# Patient Record
Sex: Female | Born: 1990 | Race: White | Hispanic: No | Marital: Married | Smoking: Never smoker
Health system: Southern US, Community
[De-identification: ages and names within clinical notes are randomized; demographics above are authoritative.]

## PROBLEM LIST (undated history)

## (undated) DIAGNOSIS — R4020 Unspecified coma: Secondary | ICD-10-CM

## (undated) DIAGNOSIS — Z789 Other specified health status: Secondary | ICD-10-CM

## (undated) DIAGNOSIS — Z3480 Encounter for supervision of other normal pregnancy, unspecified trimester: Secondary | ICD-10-CM

## (undated) HISTORY — PX: NO PAST SURGERIES: SHX2092

## (undated) HISTORY — PX: LITHOTRIPSY: SUR834

---
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
## 2010-11-26 NOTE — L&D Delivery Note (Signed)
Delivery Note At 10:25 PM a healthy female was delivered via Vaginal, Spontaneous Delivery (Presentation: Left Occiput Anterior).  APGAR: 8, 9; weight 8 lb 2.7 oz (3706 g).   Placenta status: Intact, Spontaneous.  Cord: 3 vessels with the following complications: None.    Anesthesia: Epidural  Episiotomy: None Lacerations: 2nd degree and right labial Suture Repair: 3.0 vicryl rapide Est. Blood Loss (mL): 450cc  Mom to postpartum.  Baby to nursery-stable.  Oliver Pila 10/28/2011, 10:53 PM

## 2011-03-29 LAB — HIV ANTIBODY (ROUTINE TESTING W REFLEX): HIV: NONREACTIVE

## 2011-03-29 LAB — ABO/RH: RH Type: POSITIVE

## 2011-03-29 LAB — GC/CHLAMYDIA PROBE AMP, GENITAL
Chlamydia: NEGATIVE
Gonorrhea: NEGATIVE

## 2011-03-29 LAB — RUBELLA ANTIBODY, IGM: Rubella: IMMUNE

## 2011-10-05 DIAGNOSIS — N9089 Other specified noninflammatory disorders of vulva and perineum: Secondary | ICD-10-CM

## 2011-10-20 ENCOUNTER — Encounter (HOSPITAL_COMMUNITY): Payer: Self-pay

## 2011-10-20 ENCOUNTER — Inpatient Hospital Stay (HOSPITAL_COMMUNITY)
Admission: AD | Admit: 2011-10-20 | Discharge: 2011-10-20 | Disposition: A | Discharge status: Home / Self Care | Payer: Medicaid Other | Source: Ambulatory Visit | Attending: Obstetrics and Gynecology | Admitting: Obstetrics and Gynecology

## 2011-10-20 DIAGNOSIS — N9089 Other specified noninflammatory disorders of vulva and perineum: Secondary | ICD-10-CM

## 2011-10-20 DIAGNOSIS — N9489 Other specified conditions associated with female genital organs and menstrual cycle: Secondary | ICD-10-CM | POA: Insufficient documentation

## 2011-10-20 DIAGNOSIS — O99891 Other specified diseases and conditions complicating pregnancy: Secondary | ICD-10-CM | POA: Insufficient documentation

## 2011-10-20 HISTORY — DX: Other specified health status: Z78.9

## 2011-10-20 LAB — URINALYSIS, ROUTINE W REFLEX MICROSCOPIC
Bilirubin Urine: NEGATIVE
Glucose, UA: NEGATIVE mg/dL
Ketones, ur: NEGATIVE mg/dL
Nitrite: NEGATIVE
Protein, ur: NEGATIVE mg/dL
Specific Gravity, Urine: 1.01 (ref 1.005–1.030)
Urobilinogen, UA: 0.2 mg/dL (ref 0.0–1.0)
pH: 6.5 (ref 5.0–8.0)

## 2011-10-20 LAB — URINE MICROSCOPIC-ADD ON

## 2011-10-20 NOTE — ED Provider Notes (Signed)
History     Chief Complaint  Patient presents with  . Abdominal Pain   HPI This is a 20 y.o. female at [redacted]w[redacted]d who presents with c/o labial edema which started yesterday and got worse today.  Has tried lying down for a while and did not help. No pain or irritation.Does have some mild contractions.   OB History    Grav Para Term Preterm Abortions TAB SAB Ect Mult Living   1               Past Medical History  Diagnosis Date  . No pertinent past medical history     Past Surgical History  Procedure Date  . No past surgeries     No family history on file.  History  Substance Use Topics  . Smoking status: Never Smoker   . Smokeless tobacco: Not on file  . Alcohol Use: No    Allergies: No Known Allergies  Prescriptions prior to admission  Medication Sig Dispense Refill  . Ranitidine HCl (ZANTAC PO) Take 2 tablets by mouth daily as needed. For acid reflux.         ROS As above  Physical Exam   Blood pressure 123/67, pulse 82, temperature 97.6 F (36.4 C), temperature source Oral, resp. rate 18, height 5\' 1"  (1.549 m), weight 140 lb (63.504 kg).  Physical Exam  Constitutional: She is oriented to person, place, and time. She appears well-developed and well-nourished. No distress.  HENT:  Head: Normocephalic.  Cardiovascular: Normal rate.   Respiratory: Effort normal.  GI: Soft. She exhibits no distension. There is no tenderness.  Genitourinary: Vagina normal and uterus normal. No vaginal discharge found.       Bilateral labia are edematious without erethema. Vagina is normal without edema or erethema. Cervix tight 1cm/50%/-3/vtx.  UCs q 2 min, mild with reactive FHR  Musculoskeletal: Normal range of motion. She exhibits no edema.  Neurological: She is alert and oriented to person, place, and time.  Skin: Skin is warm and dry.  Psychiatric: She has a normal mood and affect.    MAU Course  Procedures   Assessment and Plan  A:  IUP at [redacted]w[redacted]d      Bilateral  labial Edema, probably dependent P:  Discussed with Dr Kathee Polite pack to perineum      Supine, side lying position      Followup in office.      Labor precautions reviewed  [facility] 10/20/2011, 2:28 PM

## 2011-10-20 NOTE — Progress Notes (Signed)
Onset of vaginal area and pressure since this morning, had pinkish discharge two nights ago.

## 2011-10-24 ENCOUNTER — Encounter (HOSPITAL_COMMUNITY): Payer: Self-pay | Admitting: *Deleted

## 2011-10-24 ENCOUNTER — Telehealth (HOSPITAL_COMMUNITY): Payer: Self-pay | Admitting: *Deleted

## 2011-10-24 NOTE — Telephone Encounter (Signed)
Preadmission screen  

## 2011-10-28 ENCOUNTER — Inpatient Hospital Stay (HOSPITAL_COMMUNITY)
Admission: AD | Admit: 2011-10-28 | Discharge: 2011-10-28 | Disposition: A | Discharge status: Home / Self Care | Payer: Medicaid Other | Source: Ambulatory Visit | Attending: Obstetrics and Gynecology | Admitting: Obstetrics and Gynecology

## 2011-10-28 ENCOUNTER — Encounter (HOSPITAL_COMMUNITY): Payer: Self-pay | Admitting: *Deleted

## 2011-10-28 ENCOUNTER — Encounter (HOSPITAL_COMMUNITY): Payer: Self-pay | Admitting: Anesthesiology

## 2011-10-28 ENCOUNTER — Inpatient Hospital Stay (HOSPITAL_COMMUNITY): Payer: Medicaid Other | Admitting: Anesthesiology

## 2011-10-28 ENCOUNTER — Inpatient Hospital Stay (HOSPITAL_COMMUNITY)
Admission: AD | Admit: 2011-10-28 | Discharge: 2011-10-30 | DRG: 775 | Disposition: A | Discharge status: Home / Self Care | Payer: Medicaid Other | Source: Ambulatory Visit | Attending: Obstetrics and Gynecology | Admitting: Obstetrics and Gynecology

## 2011-10-28 ENCOUNTER — Other Ambulatory Visit: Payer: Self-pay | Admitting: Obstetrics and Gynecology

## 2011-10-28 DIAGNOSIS — O479 False labor, unspecified: Secondary | ICD-10-CM | POA: Insufficient documentation

## 2011-10-28 LAB — CBC
HCT: 31.7 % — ABNORMAL LOW (ref 36.0–46.0)
Hemoglobin: 10.6 g/dL — ABNORMAL LOW (ref 12.0–15.0)
RDW: 13 % (ref 11.5–15.5)
WBC: 15.7 10*3/uL — ABNORMAL HIGH (ref 4.0–10.5)

## 2011-10-28 LAB — POCT FERN TEST: Fern Test: NEGATIVE

## 2011-10-28 MED ORDER — EPHEDRINE 5 MG/ML INJ
10.0000 mg | INTRAVENOUS | Status: DC | PRN
Start: 2011-10-28 — End: 2011-10-29

## 2011-10-28 MED ORDER — TERBUTALINE SULFATE 1 MG/ML IJ SOLN: 0.2500 mg | Freq: Once | INTRAMUSCULAR | Status: AC | PRN

## 2011-10-28 MED ORDER — OXYTOCIN BOLUS FROM INFUSION
500.0000 mL | Freq: Once | INTRAVENOUS | Status: DC
  Filled 2011-10-28: qty 500
  Filled 2011-10-28: qty 1000

## 2011-10-28 MED ORDER — IBUPROFEN 600 MG PO TABS
600.0000 mg | ORAL_TABLET | Freq: Four times a day (QID) | ORAL | Status: DC
  Administered 2011-10-29 – 2011-10-30 (×5): 600 mg via ORAL
  Filled 2011-10-28 (×5): qty 1

## 2011-10-28 MED ORDER — OXYTOCIN 20 UNITS IN LACTATED RINGERS INFUSION - SIMPLE
1.0000 m[IU]/min | INTRAVENOUS | Status: DC
  Administered 2011-10-28: 2 m[IU]/min via INTRAVENOUS

## 2011-10-28 MED ORDER — EPHEDRINE 5 MG/ML INJ
10.0000 mg | INTRAVENOUS | Status: DC | PRN
Start: 2011-10-28 — End: 2011-10-29
  Filled 2011-10-28: qty 4

## 2011-10-28 MED ORDER — IBUPROFEN 600 MG PO TABS
600.0000 mg | ORAL_TABLET | Freq: Four times a day (QID) | ORAL | Status: DC | PRN
  Administered 2011-10-28: 600 mg via ORAL
  Filled 2011-10-28: qty 1

## 2011-10-28 MED ORDER — BUTORPHANOL TARTRATE 2 MG/ML IJ SOLN
1.0000 mg | Freq: Once | INTRAMUSCULAR | Status: AC
  Administered 2011-10-28: 1 mg via INTRAVENOUS
  Filled 2011-10-28: qty 1

## 2011-10-28 MED ORDER — LACTATED RINGERS IV SOLN
500.0000 mL | Freq: Once | INTRAVENOUS | Status: AC
  Administered 2011-10-28: 500 mL via INTRAVENOUS

## 2011-10-28 MED ORDER — ACETAMINOPHEN 325 MG PO TABS: 650.0000 mg | ORAL_TABLET | ORAL | Status: DC | PRN

## 2011-10-28 MED ORDER — ONDANSETRON HCL 4 MG/2ML IJ SOLN: 4.0000 mg | Freq: Four times a day (QID) | INTRAMUSCULAR | Status: DC | PRN

## 2011-10-28 MED ORDER — OXYCODONE-ACETAMINOPHEN 5-325 MG PO TABS: 2.0000 | ORAL_TABLET | ORAL | Status: DC | PRN

## 2011-10-28 MED ORDER — PROMETHAZINE HCL 25 MG/ML IJ SOLN
12.5000 mg | Freq: Once | INTRAMUSCULAR | Status: AC
  Administered 2011-10-28: 12.5 mg via INTRAVENOUS
  Filled 2011-10-28: qty 1

## 2011-10-28 MED ORDER — CITRIC ACID-SODIUM CITRATE 334-500 MG/5ML PO SOLN: 30.0000 mL | ORAL | Status: DC | PRN

## 2011-10-28 MED ORDER — PHENYLEPHRINE 40 MCG/ML (10ML) SYRINGE FOR IV PUSH (FOR BLOOD PRESSURE SUPPORT)
80.0000 ug | PREFILLED_SYRINGE | INTRAVENOUS | Status: DC | PRN
  Filled 2011-10-28: qty 5

## 2011-10-28 MED ORDER — FENTANYL 2.5 MCG/ML BUPIVACAINE 1/10 % EPIDURAL INFUSION (WH - ANES)
INTRAMUSCULAR | Status: DC | PRN
  Administered 2011-10-28: 13 mL/h via EPIDURAL

## 2011-10-28 MED ORDER — LACTATED RINGERS IV SOLN
500.0000 mL | INTRAVENOUS | Status: DC | PRN
  Administered 2011-10-28: 300 mL via INTRAVENOUS

## 2011-10-28 MED ORDER — LIDOCAINE HCL 1.5 % IJ SOLN
INTRAMUSCULAR | Status: DC | PRN
  Administered 2011-10-28: 3 mL via EPIDURAL
  Administered 2011-10-28: 4 mL via EPIDURAL

## 2011-10-28 MED ORDER — LIDOCAINE HCL (PF) 1 % IJ SOLN
30.0000 mL | INTRAMUSCULAR | Status: DC | PRN
Start: 2011-10-28 — End: 2011-10-29
  Filled 2011-10-28: qty 30

## 2011-10-28 MED ORDER — FENTANYL 2.5 MCG/ML BUPIVACAINE 1/10 % EPIDURAL INFUSION (WH - ANES)
14.0000 mL/h | INTRAMUSCULAR | Status: DC
  Administered 2011-10-28: 14 mL/h via EPIDURAL
  Filled 2011-10-28 (×2): qty 60

## 2011-10-28 MED ORDER — FLEET ENEMA 7-19 GM/118ML RE ENEM: 1.0000 | ENEMA | RECTAL | Status: DC | PRN

## 2011-10-28 MED ORDER — ZOLPIDEM TARTRATE 10 MG PO TABS
10.0000 mg | ORAL_TABLET | Freq: Once | ORAL | Status: AC
  Administered 2011-10-28: 10 mg via ORAL
  Filled 2011-10-28: qty 1

## 2011-10-28 MED ORDER — LACTATED RINGERS IV SOLN
INTRAVENOUS | Status: DC
  Administered 2011-10-28 (×2): via INTRAVENOUS

## 2011-10-28 MED ORDER — PHENYLEPHRINE 40 MCG/ML (10ML) SYRINGE FOR IV PUSH (FOR BLOOD PRESSURE SUPPORT): 80.0000 ug | PREFILLED_SYRINGE | INTRAVENOUS | Status: DC | PRN

## 2011-10-28 MED ORDER — OXYTOCIN 20 UNITS IN LACTATED RINGERS INFUSION - SIMPLE
125.0000 mL/h | Freq: Once | INTRAVENOUS | Status: AC
  Administered 2011-10-28: 500 mL/h via INTRAVENOUS

## 2011-10-28 MED ORDER — DIPHENHYDRAMINE HCL 50 MG/ML IJ SOLN: 12.5000 mg | INTRAMUSCULAR | Status: DC | PRN

## 2011-10-28 NOTE — Progress Notes (Signed)
Notified of no cervical change. OK to d/c home

## 2011-10-28 NOTE — Progress Notes (Signed)
Was given Ambien prior to discharge dizziness, nausea,  Unsure if water has broken.

## 2011-10-28 NOTE — Progress Notes (Signed)
Dr. Senaida Ores updated on pt status. Pt to have epidural for pain

## 2011-10-28 NOTE — Progress Notes (Signed)
Dr Senaida Ores notified of pt's admission and status. Aware of ctx pattern, sve, FHR baseline. Will observe for an hour and reck cervix and d/c home if no change and FHR reactive.

## 2011-10-28 NOTE — Progress Notes (Signed)
Pt was admitted to L&D and received stadol x 1 then an epidural.  She made progress and was 4cm after her epidural.  She had a few late decels that resolved with O2 and repositioning.  She was given 2mu pitocin, but this was discontinued with the lates and she has made progress without it.    Subjective: Comfortable with epidural   Objective: BP 107/62  Pulse 100  Temp(Src) 98.2 F (36.8 C) (Oral)  Resp 18  Ht 5\' 5"  (1.651 m)  Wt 63.05 kg (139 lb)  BMI 23.13 kg/m2  SpO2 100%      FHT:  FHR: 130 bpm, variability: moderate,  accelerations:  Present,  decelerations:  Absent UC:   regular, every 1-3 minutes SVE:   90/5-6/0  When going to rupture patient noted she had already ruptured at 8-830pm  Very light meconium seen. Labs: Lab Results  Component Value Date   WBC 15.7* 10/28/2011   HGB 10.6* 10/28/2011   HCT 31.7* 10/28/2011   MCV 89.3 10/28/2011   PLT 260 10/28/2011    Assessment / Plan: Spontaneous labor, progressing normally  Doing well, making progress FHR now category II  Good variability  Tadan Shill W 10/28/2011, 9:09 PM

## 2011-10-28 NOTE — Progress Notes (Signed)
Written and verbal d/c instructions given and understanding vocied. To return in am for induction as scheduled if does not return sooner or if Dr Ambrose Mantle calls pt this am and has her return tonight for cervidil as MD discussed with pt at last office visit

## 2011-10-28 NOTE — Anesthesia Preprocedure Evaluation (Signed)
Anesthesia Evaluation  Patient identified by MRN, date of birth, ID band Patient awake    Reviewed: Allergy & Precautions, H&P , Patient's Chart, lab work & pertinent test results  Airway Mallampati: III TM Distance: >3 FB Neck ROM: full    Dental No notable dental hx. (+) Teeth Intact   Pulmonary neg pulmonary ROS,  clear to auscultation  Pulmonary exam normal       Cardiovascular neg cardio ROS regular Normal    Neuro/Psych Negative Neurological ROS  Negative Psych ROS   GI/Hepatic negative GI ROS, Neg liver ROS, GERD-  Medicated and Controlled,  Endo/Other  Negative Endocrine ROS  Renal/GU negative Renal ROS  Genitourinary negative   Musculoskeletal   Abdominal   Peds  Hematology negative hematology ROS (+)   Anesthesia Other Findings   Reproductive/Obstetrics (+) Pregnancy                           Anesthesia Physical Anesthesia Plan  ASA: II  Anesthesia Plan: Epidural   Post-op Pain Management:    Induction:   Airway Management Planned:   Additional Equipment:   Intra-op Plan:   Post-operative Plan:   Informed Consent: I have reviewed the patients History and Physical, chart, labs and discussed the procedure including the risks, benefits and alternatives for the proposed anesthesia with the patient or authorized representative who has indicated his/her understanding and acceptance.     Plan Discussed with: Anesthesiologist and Surgeon  Anesthesia Plan Comments:         Anesthesia Quick Evaluation

## 2011-10-28 NOTE — Progress Notes (Signed)
Went home, did not go to bed after receiving ambien, ctx worsened.

## 2011-10-28 NOTE — Progress Notes (Signed)
G1 at 40.5wks. Ctxs since 0230. Denies leaking fld or bleeding

## 2011-10-28 NOTE — Progress Notes (Signed)
Pt aware of plan of care and agrees. States Dr Ambrose Mantle was to call her this am about what time to come Sunday pm for cervidil. Scheduled for induction Monday am.

## 2011-10-28 NOTE — Anesthesia Procedure Notes (Signed)
Epidural Patient location during procedure: OB Start time: 10/28/2011 6:08 PM  Staffing Anesthesiologist: Hailey Stormer A. Performed by: anesthesiologist   Preanesthetic Checklist Completed: patient identified, site marked, surgical consent, pre-op evaluation, timeout performed, IV checked, risks and benefits discussed and monitors and equipment checked  Epidural Patient position: sitting Prep: site prepped and draped and DuraPrep Patient monitoring: continuous pulse ox and blood pressure Approach: midline Injection technique: LOR air  Needle:  Needle type: Tuohy  Needle gauge: 17 G Needle length: 9 cm Needle insertion depth: 6 cm Catheter type: closed end flexible Catheter size: 19 Gauge Catheter at skin depth: 11 cm Test dose: negative and 1.5% lidocaine  Assessment Events: blood not aspirated, injection not painful, no injection resistance, negative IV test and no paresthesia  Additional Notes Patient is more comfortable after epidural dosed. Please see RN's note for documentation of vital signs and FHR which are stable.

## 2011-10-28 NOTE — H&P (Signed)
Laura Little is a 20 y.o. female G1P0 at 15 6/7 weeks (EDD 10/23/11 by a 10 week [place]) presenting for third visit to MAU today for latent phase labor.  Cervix has minimally changed from 1 CM to 90% effaced and 1-2 cm.  Prenatal care has been uneventrful.  Maternal Medical History:  Reason for admission: Reason for admission: contractions.  Contractions: Onset was 6-12 hours ago.   Frequency: regular.   Perceived severity is strong.    Fetal activity: Perceived fetal activity is normal.      OB History    Grav Para Term Preterm Abortions TAB SAB Ect Mult Living   1              Past Medical History  Diagnosis Date  . No pertinent past medical history    Past Surgical History  Procedure Date  . No past surgeries    Family History: family history includes Heart attack in her paternal grandfather and Hypertension in her paternal grandfather. Social History:  reports that she has never smoked. She does not have any smokeless tobacco history on file. She reports that she does not drink alcohol or use illicit drugs.  ROS  Dilation: 1.5 Effacement (%): 90 Station: -1 Exam by:: L Davis RN Blood pressure 114/64, pulse 87, temperature 98 F (36.7 C), resp. rate 18, height 5\' 5"  (1.651 m), weight 63.05 kg (139 lb). Maternal Exam:  Uterine Assessment: Contraction strength is moderate.  Contraction frequency is regular.   Abdomen: Patient reports no abdominal tenderness. Fetal presentation: vertex  Introitus: Normal vulva. Normal vagina.    Physical Exam  Constitutional: She is oriented to person, place, and time. She appears well-developed and well-nourished.  Cardiovascular: Normal rate and regular rhythm.   Respiratory: Effort normal and breath sounds normal.  GI: Soft. Bowel sounds are normal.  Genitourinary: Vagina normal and uterus normal.       Cervix 90/1-2cm per RN  Neurological: She is alert and oriented to person, place, and time.  Psychiatric: She has a normal mood  and affect. Her behavior is normal.    Prenatal labs: ABO, Rh: A/Positive/-- (05/03 0000) Antibody: Negative (05/03 0000) Rubella: Immune (05/03 0000) RPR: Nonreactive (05/03 0000)  HBsAg: Negative (05/03 0000)  HIV: Non-reactive (05/03 0000)  GBS: Negative (10/26 0000)  One hour glucola 95 Quad screen WNL  Assessment/Plan: Pt being admitted in latent phase labor for discomfort, will augment as needed. Epidural as needed.  Oliver Pila 10/28/2011, 2:46 PM

## 2011-10-29 ENCOUNTER — Inpatient Hospital Stay (HOSPITAL_COMMUNITY): Admission: RE | Admit: 2011-10-29 | Payer: Medicaid Other | Source: Ambulatory Visit

## 2011-10-29 LAB — CBC
Hemoglobin: 8.6 g/dL — ABNORMAL LOW (ref 12.0–15.0)
MCH: 29.8 pg (ref 26.0–34.0)
RBC: 2.89 MIL/uL — ABNORMAL LOW (ref 3.87–5.11)

## 2011-10-29 MED ORDER — BENZOCAINE-MENTHOL 20-0.5 % EX AERO
INHALATION_SPRAY | CUTANEOUS | Status: AC
  Administered 2011-10-29: 1 via TOPICAL
  Filled 2011-10-29: qty 56

## 2011-10-29 MED ORDER — SIMETHICONE 80 MG PO CHEW: 80.0000 mg | CHEWABLE_TABLET | ORAL | Status: DC | PRN

## 2011-10-29 MED ORDER — OXYCODONE-ACETAMINOPHEN 5-325 MG PO TABS
1.0000 | ORAL_TABLET | ORAL | Status: DC | PRN
Start: 2011-10-29 — End: 2011-10-30
  Administered 2011-10-29: 1 via ORAL
  Filled 2011-10-29: qty 1

## 2011-10-29 MED ORDER — DIBUCAINE 1 % RE OINT: 1.0000 "application " | TOPICAL_OINTMENT | RECTAL | Status: DC | PRN

## 2011-10-29 MED ORDER — TETANUS-DIPHTH-ACELL PERTUSSIS 5-2.5-18.5 LF-MCG/0.5 IM SUSP
0.5000 mL | Freq: Once | INTRAMUSCULAR | Status: AC
  Administered 2011-10-29: 0.5 mL via INTRAMUSCULAR
  Filled 2011-10-29: qty 0.5

## 2011-10-29 MED ORDER — ONDANSETRON HCL 4 MG/2ML IJ SOLN: 4.0000 mg | INTRAMUSCULAR | Status: DC | PRN

## 2011-10-29 MED ORDER — LANOLIN HYDROUS EX OINT: TOPICAL_OINTMENT | CUTANEOUS | Status: DC | PRN

## 2011-10-29 MED ORDER — DIPHENHYDRAMINE HCL 25 MG PO CAPS: 25.0000 mg | ORAL_CAPSULE | Freq: Four times a day (QID) | ORAL | Status: DC | PRN

## 2011-10-29 MED ORDER — SENNOSIDES-DOCUSATE SODIUM 8.6-50 MG PO TABS: 2.0000 | ORAL_TABLET | Freq: Every day | ORAL | Status: DC

## 2011-10-29 MED ORDER — PRENATAL PLUS 27-1 MG PO TABS
1.0000 | ORAL_TABLET | Freq: Every day | ORAL | Status: DC
  Administered 2011-10-29 – 2011-10-30 (×2): 1 via ORAL
  Filled 2011-10-29 (×2): qty 1

## 2011-10-29 MED ORDER — ZOLPIDEM TARTRATE 5 MG PO TABS: 5.0000 mg | ORAL_TABLET | Freq: Every evening | ORAL | Status: DC | PRN

## 2011-10-29 MED ORDER — WITCH HAZEL-GLYCERIN EX PADS: 1.0000 "application " | MEDICATED_PAD | CUTANEOUS | Status: DC | PRN

## 2011-10-29 MED ORDER — BENZOCAINE-MENTHOL 20-0.5 % EX AERO
1.0000 "application " | INHALATION_SPRAY | CUTANEOUS | Status: DC | PRN
  Administered 2011-10-29: 1 via TOPICAL

## 2011-10-29 MED ORDER — ONDANSETRON HCL 4 MG PO TABS: 4.0000 mg | ORAL_TABLET | ORAL | Status: DC | PRN

## 2011-10-29 NOTE — Progress Notes (Signed)
Pt taken to mbw room 130 via wheelchair at 0010, support person present, report given to mb rn

## 2011-10-29 NOTE — Progress Notes (Signed)
UR chart review completed.  

## 2011-10-29 NOTE — Progress Notes (Signed)
Post Partum Day 1 Subjective: no complaints and tolerating PO  Objective: Blood pressure 107/65, pulse 97, temperature 98.6 F (37 C), temperature source Oral, resp. rate 18, height 5\' 5"  (1.651 m), weight 63.05 kg (139 lb), SpO2 99.00%, unknown if currently breastfeeding.  Physical Exam:  General: alert Lochia: appropriate Uterine Fundus: firm    Basename 10/29/11 0520 10/28/11 1542  HGB 8.6* 10.6*  HCT 25.8* 31.7*    Assessment/Plan: Plan for discharge tomorrow   LOS: 1 day   Hyatt Capobianco W 10/29/2011, 10:39 AM

## 2011-10-29 NOTE — Anesthesia Postprocedure Evaluation (Signed)
  Anesthesia Post-op Note  Patient: Laura Little  Procedure(s) Performed: * No procedures listed *  Patient Location: Mother/Baby  Anesthesia Type: Epidural  Level of Consciousness: alert  and oriented  Airway and Oxygen Therapy: Patient Spontanous Breathing  Post-op Pain: mild  Post-op Assessment: Patient's Cardiovascular Status Stable and Respiratory Function Stable  Post-op Vital Signs: stable  Complications: No apparent anesthesia complications

## 2011-10-30 MED ORDER — IBUPROFEN 600 MG PO TABS: 600.0000 mg | ORAL_TABLET | Freq: Four times a day (QID) | ORAL | Status: AC

## 2011-10-30 NOTE — Discharge Summary (Signed)
Obstetric Discharge Summary Reason for Admission: onset of labor Prenatal Procedures: none Intrapartum Procedures: spontaneous vaginal delivery Postpartum Procedures: none Complications-Operative and Postpartum: second degree perineal laceration and right labial laceration Hemoglobin  Date Value Range Status  10/29/2011 8.6* 12.0-15.0 (g/dL) Final     DELTA CHECK NOTED     REPEATED TO VERIFY     HCT  Date Value Range Status  10/29/2011 25.8* 36.0-46.0 (%) Final    Discharge Diagnoses: Term Pregnancy-delivered  Discharge Information: Date: 10/30/2011 Activity: pelvic rest Diet: routine Medications: Ibuprofen Condition: improved Instructions: refer to practice specific booklet Discharge to: home Follow-up Information    Follow up with Rui Wordell W. Make an appointment in 6 weeks.   Contact information:   [address] [place] [address] [phone]          Newborn Data: Live born female  Birth Weight: 8 lb 2.7 oz (3706 g) APGAR: 8, 9  Home with mother.  Laura Little 10/30/2011, 9:20 AM

## 2011-10-30 NOTE — Progress Notes (Signed)
Post Partum Day 2 Subjective: no complaints and tolerating PO  Objective: Blood pressure 116/64, pulse 69, temperature 98.8 F (37.1 C), temperature source Oral, resp. rate 18, height 5\' 5"  (1.651 m), weight 63.05 kg (139 lb), SpO2 99.00%, unknown if currently breastfeeding.  Physical Exam:  General: alert Lochia: appropriate Uterine Fundus: firm   Basename 10/29/11 0520 10/28/11 1542  HGB 8.6* 10.6*  HCT 25.8* 31.7*    Assessment/Plan: Discharge home F/u office 6 weeks   LOS: 2 days   Azaiah Mello W 10/30/2011, 8:37 AM

## 2013-07-21 LAB — OB RESULTS CONSOLE GC/CHLAMYDIA
Chlamydia: NEGATIVE
Gonorrhea: NEGATIVE

## 2013-07-21 LAB — OB RESULTS CONSOLE ABO/RH: RH TYPE: POSITIVE

## 2013-07-21 LAB — OB RESULTS CONSOLE HEPATITIS B SURFACE ANTIGEN: HEP B S AG: NEGATIVE

## 2013-07-21 LAB — OB RESULTS CONSOLE RUBELLA ANTIBODY, IGM: Rubella: IMMUNE

## 2013-07-21 LAB — OB RESULTS CONSOLE RPR: RPR: NONREACTIVE

## 2013-07-21 LAB — OB RESULTS CONSOLE ANTIBODY SCREEN: ANTIBODY SCREEN: NEGATIVE

## 2013-07-21 LAB — OB RESULTS CONSOLE HIV ANTIBODY (ROUTINE TESTING): HIV: NONREACTIVE

## 2013-11-26 NOTE — L&D Delivery Note (Signed)
Delivery Note At 4:36 PM a viable and healthy female was delivered via Vaginal, Spontaneous Delivery (Presentation: ; Occiput Anterior).  APGAR: 8, 9; weight P.   Placenta status: Intact, Spontaneous.  Cord: 3 vessels with the following complications: None.   Anesthesia: Epidural  Episiotomy: None Lacerations: 2nd degree;Perineal Suture Repair: 3.0 vicryl Est. Blood Loss (mL): 400cc  Mom to postpartum.  Baby to Couplet care / Skin to Skin.  Stuckert, Danisa Kopec Bovard 02/22/2014, 4:57 PM  Br/ Contra - condoms/ A+ / RI

## 2014-02-17 ENCOUNTER — Telehealth (HOSPITAL_COMMUNITY): Payer: Self-pay | Admitting: *Deleted

## 2014-02-17 ENCOUNTER — Encounter (HOSPITAL_COMMUNITY): Payer: Self-pay | Admitting: *Deleted

## 2014-02-17 NOTE — Telephone Encounter (Signed)
Preadmission screen  

## 2014-02-21 ENCOUNTER — Encounter (HOSPITAL_COMMUNITY): Payer: Self-pay

## 2014-02-21 DIAGNOSIS — R4020 Unspecified coma: Secondary | ICD-10-CM

## 2014-02-21 DIAGNOSIS — Z3480 Encounter for supervision of other normal pregnancy, unspecified trimester: Secondary | ICD-10-CM

## 2014-02-21 HISTORY — DX: Unspecified coma: R40.20

## 2014-02-21 HISTORY — DX: Encounter for supervision of other normal pregnancy, unspecified trimester: Z34.80

## 2014-02-21 MED ORDER — FAMOTIDINE 20 MG PO TABS
20.0000 mg | ORAL_TABLET | Freq: Every day | ORAL | Status: DC
  Filled 2014-02-21: qty 1

## 2014-02-21 NOTE — H&P (Signed)
Laura Little is a 23 y.o. female G2P1001 at 33 week for IOL, pt with increasing vasovagal sx's at term.  +FM, no LOF, no VB, occ ctx.  Relatively uncomplicated PNC, see records for details. D/W pt r/b/a of IOL, wishes to proceed.    Maternal Medical History:  Contractions: Frequency: irregular.    Fetal activity: Perceived fetal activity is normal.    Prenatal complications: no prenatal complications Prenatal Complications - Diabetes: none.    OB History   Grav Para Term Preterm Abortions TAB SAB Ect Mult Living   2 1 1       1     G1 TSVD 8#2, female G2 present No abn pap, No STD  Past Medical History  Diagnosis Date  . No pertinent past medical history   . Normal pregnancy, repeat 02/21/2014  . LOC (loss of consciousness) 02/21/2014   Past Surgical History  Procedure Laterality Date  . No past surgeries     Family History: family history includes Heart attack in her paternal grandfather; Hypertension in her paternal grandfather. Social History:  reports that she has never smoked. She has never used smokeless tobacco. She reports that she does not drink alcohol or use illicit drugs.married, dental asst Meds PNV All NKDA   Prenatal Transfer Tool  Maternal Diabetes: No Genetic Screening: Declined Maternal Ultrasounds/Referrals: Normal Fetal Ultrasounds or other Referrals:  None Maternal Substance Abuse:  No Significant Maternal Medications:  None Significant Maternal Lab Results:  Lab values include: Group B Strep negative Other Comments:  None  Review of Systems  Constitutional: Negative.   HENT: Negative.   Eyes: Negative.   Respiratory: Negative.   Cardiovascular: Negative.   Gastrointestinal: Negative.   Genitourinary: Negative.   Musculoskeletal: Negative.   Skin: Negative.   Neurological: Negative.   Psychiatric/Behavioral: Negative.       Last menstrual period 06/02/2013, unknown if currently breastfeeding. Maternal Exam:  Uterine Assessment:  Contraction frequency is irregular.   Abdomen: Fundal height is appropriate for gestation.   Estimated fetal weight is 7.5-8.5#.   Fetal presentation: vertex  Introitus: Normal vulva. Normal vagina.  Pelvis: adequate for delivery.   Cervix: Cervix evaluated by digital exam.     Physical Exam  Constitutional: She is oriented to person, place, and time. She appears well-developed and well-nourished.  HENT:  Head: Normocephalic and atraumatic.  Cardiovascular: Normal rate and regular rhythm.   Respiratory: Effort normal and breath sounds normal. No respiratory distress. She has no wheezes.  GI: Soft. Bowel sounds are normal. She exhibits no distension. There is no tenderness.  Musculoskeletal: Normal range of motion.  Neurological: She is alert and oriented to person, place, and time.  Skin: Skin is warm and dry.  Psychiatric: She has a normal mood and affect. Her behavior is normal.    Prenatal labs: ABO, Rh: A/Positive/-- (08/26 0000) Antibody: Negative (08/26 0000) Rubella: Immune (08/26 0000) RPR: Nonreactive (08/26 0000)  HBsAg: Negative (08/26 0000)  HIV: Non-reactive (08/26 0000)  GBS:   neg  Hgb 12.4/Pl 234K/Pap WNL/Ur Cx neg/GC neg/ Chl neg/CF neg/glucola 98  Dated by 1st tri [place] EDC 03/01/14 Nl anat, post plac, female  Tdap 12/11/13, Flu 10/16/13  Assessment/Plan: 23yo G2P1001 at 39 wk for IOL gbbs neg IOL with pitocin/AROM Expect SVD Epidural prn   Stuckert, Aidenjames Heckmann Bovard 02/21/2014, 8:54 PM

## 2014-02-22 ENCOUNTER — Encounter (HOSPITAL_COMMUNITY): Payer: BC Managed Care – PPO | Admitting: Anesthesiology

## 2014-02-22 ENCOUNTER — Inpatient Hospital Stay (HOSPITAL_COMMUNITY): Payer: BC Managed Care – PPO | Admitting: Anesthesiology

## 2014-02-22 ENCOUNTER — Encounter (HOSPITAL_COMMUNITY): Payer: Self-pay | Admitting: *Deleted

## 2014-02-22 ENCOUNTER — Inpatient Hospital Stay (HOSPITAL_COMMUNITY)
Admission: AD | Admit: 2014-02-22 | Discharge: 2014-02-23 | DRG: 775 | Disposition: A | Discharge status: Home / Self Care | Payer: BC Managed Care – PPO | Source: Ambulatory Visit | Attending: Obstetrics and Gynecology | Admitting: Obstetrics and Gynecology

## 2014-02-22 ENCOUNTER — Inpatient Hospital Stay (HOSPITAL_COMMUNITY)
Admission: RE | Admit: 2014-02-22 | Discharge: 2014-02-22 | Disposition: A | Discharge status: Home / Self Care | Payer: Medicaid Other | Source: Ambulatory Visit | Attending: Obstetrics and Gynecology | Admitting: Obstetrics and Gynecology

## 2014-02-22 DIAGNOSIS — Z3480 Encounter for supervision of other normal pregnancy, unspecified trimester: Secondary | ICD-10-CM

## 2014-02-22 DIAGNOSIS — Z3490 Encounter for supervision of normal pregnancy, unspecified, unspecified trimester: Secondary | ICD-10-CM

## 2014-02-22 DIAGNOSIS — R4020 Unspecified coma: Secondary | ICD-10-CM

## 2014-02-22 DIAGNOSIS — K219 Gastro-esophageal reflux disease without esophagitis: Secondary | ICD-10-CM | POA: Diagnosis present

## 2014-02-22 HISTORY — DX: Encounter for supervision of other normal pregnancy, unspecified trimester: Z34.80

## 2014-02-22 HISTORY — DX: Unspecified coma: R40.20

## 2014-02-22 LAB — CBC
HEMATOCRIT: 31.4 % — AB (ref 36.0–46.0)
Hemoglobin: 10.3 g/dL — ABNORMAL LOW (ref 12.0–15.0)
MCH: 29.5 pg (ref 26.0–34.0)
MCHC: 32.8 g/dL (ref 30.0–36.0)
MCV: 90 fL (ref 78.0–100.0)
Platelets: 283 10*3/uL (ref 150–400)
RBC: 3.49 MIL/uL — AB (ref 3.87–5.11)
RDW: 13.5 % (ref 11.5–15.5)
WBC: 8.2 10*3/uL (ref 4.0–10.5)

## 2014-02-22 LAB — RPR: RPR: NONREACTIVE

## 2014-02-22 LAB — OB RESULTS CONSOLE GBS: GBS: NEGATIVE

## 2014-02-22 MED ORDER — LACTATED RINGERS IV SOLN: INTRAVENOUS | Status: DC

## 2014-02-22 MED ORDER — LIDOCAINE HCL (PF) 1 % IJ SOLN
30.0000 mL | INTRAMUSCULAR | Status: DC | PRN
  Filled 2014-02-22: qty 30

## 2014-02-22 MED ORDER — BUTORPHANOL TARTRATE 1 MG/ML IJ SOLN: 1.0000 mg | INTRAMUSCULAR | Status: DC | PRN

## 2014-02-22 MED ORDER — SENNOSIDES-DOCUSATE SODIUM 8.6-50 MG PO TABS
2.0000 | ORAL_TABLET | ORAL | Status: DC
  Administered 2014-02-22: 2 via ORAL
  Filled 2014-02-22: qty 2

## 2014-02-22 MED ORDER — DIBUCAINE 1 % RE OINT
1.0000 "application " | TOPICAL_OINTMENT | RECTAL | Status: DC | PRN
  Administered 2014-02-23: 1 via RECTAL
  Filled 2014-02-22: qty 28

## 2014-02-22 MED ORDER — FENTANYL 2.5 MCG/ML BUPIVACAINE 1/10 % EPIDURAL INFUSION (WH - ANES)
INTRAMUSCULAR | Status: DC | PRN
  Administered 2014-02-22: 14 mL/h via EPIDURAL

## 2014-02-22 MED ORDER — PRENATAL MULTIVITAMIN CH
1.0000 | ORAL_TABLET | Freq: Every day | ORAL | Status: DC
  Administered 2014-02-23: 1 via ORAL
  Filled 2014-02-22: qty 1

## 2014-02-22 MED ORDER — DIPHENHYDRAMINE HCL 50 MG/ML IJ SOLN: 12.5000 mg | INTRAMUSCULAR | Status: DC | PRN

## 2014-02-22 MED ORDER — OXYTOCIN 40 UNITS IN LACTATED RINGERS INFUSION - SIMPLE MED: 62.5000 mL/h | INTRAVENOUS | Status: DC

## 2014-02-22 MED ORDER — BENZOCAINE-MENTHOL 20-0.5 % EX AERO
1.0000 "application " | INHALATION_SPRAY | CUTANEOUS | Status: DC | PRN
  Filled 2014-02-22: qty 56

## 2014-02-22 MED ORDER — DIPHENHYDRAMINE HCL 25 MG PO CAPS: 25.0000 mg | ORAL_CAPSULE | Freq: Four times a day (QID) | ORAL | Status: DC | PRN

## 2014-02-22 MED ORDER — SIMETHICONE 80 MG PO CHEW: 80.0000 mg | CHEWABLE_TABLET | ORAL | Status: DC | PRN

## 2014-02-22 MED ORDER — OXYTOCIN BOLUS FROM INFUSION: 500.0000 mL | INTRAVENOUS | Status: DC

## 2014-02-22 MED ORDER — EPHEDRINE 5 MG/ML INJ
10.0000 mg | INTRAVENOUS | Status: DC | PRN
  Filled 2014-02-22: qty 4
  Filled 2014-02-22: qty 2

## 2014-02-22 MED ORDER — FAMOTIDINE 20 MG PO TABS: 20.0000 mg | ORAL_TABLET | Freq: Every day | ORAL | Status: DC

## 2014-02-22 MED ORDER — PHENYLEPHRINE 40 MCG/ML (10ML) SYRINGE FOR IV PUSH (FOR BLOOD PRESSURE SUPPORT)
80.0000 ug | PREFILLED_SYRINGE | INTRAVENOUS | Status: DC | PRN
  Filled 2014-02-22: qty 10
  Filled 2014-02-22: qty 2

## 2014-02-22 MED ORDER — FLEET ENEMA 7-19 GM/118ML RE ENEM: 1.0000 | ENEMA | RECTAL | Status: DC | PRN

## 2014-02-22 MED ORDER — FENTANYL 2.5 MCG/ML BUPIVACAINE 1/10 % EPIDURAL INFUSION (WH - ANES)
14.0000 mL/h | INTRAMUSCULAR | Status: DC | PRN
  Filled 2014-02-22: qty 125

## 2014-02-22 MED ORDER — OXYTOCIN 40 UNITS IN LACTATED RINGERS INFUSION - SIMPLE MED
1.0000 m[IU]/min | INTRAVENOUS | Status: DC
  Administered 2014-02-22: 666 m[IU]/min via INTRAVENOUS
  Administered 2014-02-22: 2 m[IU]/min via INTRAVENOUS
  Filled 2014-02-22: qty 1000

## 2014-02-22 MED ORDER — LACTATED RINGERS IV SOLN
500.0000 mL | INTRAVENOUS | Status: DC | PRN
Start: 2014-02-22 — End: 2014-02-22

## 2014-02-22 MED ORDER — TERBUTALINE SULFATE 1 MG/ML IJ SOLN: 0.2500 mg | Freq: Once | INTRAMUSCULAR | Status: DC | PRN

## 2014-02-22 MED ORDER — PHENYLEPHRINE 40 MCG/ML (10ML) SYRINGE FOR IV PUSH (FOR BLOOD PRESSURE SUPPORT)
80.0000 ug | PREFILLED_SYRINGE | INTRAVENOUS | Status: DC | PRN
  Filled 2014-02-22: qty 2

## 2014-02-22 MED ORDER — IBUPROFEN 600 MG PO TABS
600.0000 mg | ORAL_TABLET | Freq: Four times a day (QID) | ORAL | Status: DC
  Administered 2014-02-22 – 2014-02-23 (×3): 600 mg via ORAL
  Filled 2014-02-22 (×3): qty 1

## 2014-02-22 MED ORDER — LACTATED RINGERS IV SOLN: 500.0000 mL | Freq: Once | INTRAVENOUS | Status: DC

## 2014-02-22 MED ORDER — LANOLIN HYDROUS EX OINT: TOPICAL_OINTMENT | CUTANEOUS | Status: DC | PRN

## 2014-02-22 MED ORDER — ONDANSETRON HCL 4 MG/2ML IJ SOLN: 4.0000 mg | INTRAMUSCULAR | Status: DC | PRN

## 2014-02-22 MED ORDER — LIDOCAINE HCL (PF) 1 % IJ SOLN
INTRAMUSCULAR | Status: DC | PRN
  Administered 2014-02-22 (×2): 4 mL

## 2014-02-22 MED ORDER — OXYCODONE-ACETAMINOPHEN 5-325 MG PO TABS: 1.0000 | ORAL_TABLET | ORAL | Status: DC | PRN

## 2014-02-22 MED ORDER — EPHEDRINE 5 MG/ML INJ
10.0000 mg | INTRAVENOUS | Status: DC | PRN
  Filled 2014-02-22: qty 2

## 2014-02-22 MED ORDER — ONDANSETRON HCL 4 MG/2ML IJ SOLN: 4.0000 mg | Freq: Four times a day (QID) | INTRAMUSCULAR | Status: DC | PRN

## 2014-02-22 MED ORDER — WITCH HAZEL-GLYCERIN EX PADS
1.0000 "application " | MEDICATED_PAD | CUTANEOUS | Status: DC | PRN
  Administered 2014-02-23: 1 via TOPICAL

## 2014-02-22 MED ORDER — IBUPROFEN 600 MG PO TABS: 600.0000 mg | ORAL_TABLET | Freq: Four times a day (QID) | ORAL | Status: DC | PRN

## 2014-02-22 MED ORDER — ONDANSETRON HCL 4 MG PO TABS: 4.0000 mg | ORAL_TABLET | ORAL | Status: DC | PRN

## 2014-02-22 MED ORDER — ZOLPIDEM TARTRATE 5 MG PO TABS: 5.0000 mg | ORAL_TABLET | Freq: Every evening | ORAL | Status: DC | PRN

## 2014-02-22 MED ORDER — CITRIC ACID-SODIUM CITRATE 334-500 MG/5ML PO SOLN
30.0000 mL | ORAL | Status: DC | PRN
  Administered 2014-02-22: 30 mL via ORAL
  Filled 2014-02-22: qty 15

## 2014-02-22 MED ORDER — LACTATED RINGERS IV SOLN
INTRAVENOUS | Status: DC
Start: 2014-02-22 — End: 2014-02-22
  Administered 2014-02-22 (×2): via INTRAVENOUS

## 2014-02-22 MED ORDER — ACETAMINOPHEN 325 MG PO TABS: 650.0000 mg | ORAL_TABLET | ORAL | Status: DC | PRN

## 2014-02-22 NOTE — Anesthesia Procedure Notes (Signed)
Epidural Patient location during procedure: OB Start time: 02/22/2014 1:55 PM  Staffing Anesthesiologist: Larena Ohnemus A. Performed by: anesthesiologist   Preanesthetic Checklist Completed: patient identified, site marked, surgical consent, pre-op evaluation, timeout performed, IV checked, risks and benefits discussed and monitors and equipment checked  Epidural Patient position: sitting Prep: site prepped and draped and DuraPrep Patient monitoring: continuous pulse ox and blood pressure Approach: midline Location: L3-L4 Injection technique: LOR air  Needle:  Needle type: Tuohy  Needle gauge: 17 G Needle length: 9 cm and 9 Needle insertion depth: 4 cm Catheter type: closed end flexible Catheter size: 19 Gauge Catheter at skin depth: 9 cm Test dose: negative and Other  Assessment Events: blood not aspirated, injection not painful, no injection resistance, negative IV test and no paresthesia  Additional Notes  Patient identified. Risks and benefits discussed including failed block, incomplete  Pain control, post dural puncture headache, nerve damage, paralysis, blood pressure Changes, nausea, vomiting, reactions to medications-both toxic and allergic and post Partum back pain. All questions were answered. Patient expressed understanding and wished to proceed. Sterile technique was used throughout procedure. Epidural site was Dressed with sterile barrier dressing. No paresthesias, signs of intravascular injection Or signs of intrathecal spread were encountered.  Patient was more comfortable after the epidural was dosed. Please see RN's note for documentation of vital signs and FHR which are stable.

## 2014-02-22 NOTE — Anesthesia Preprocedure Evaluation (Signed)
Anesthesia Evaluation  Patient identified by MRN, date of birth, ID band Patient awake    Reviewed: Allergy & Precautions, H&P , Patient's Chart, lab work & pertinent test results  Airway Mallampati: II TM Distance: >3 FB Neck ROM: Full    Dental no notable dental hx. (+) Teeth Intact   Pulmonary neg pulmonary ROS,  breath sounds clear to auscultation  Pulmonary exam normal       Cardiovascular negative cardio ROS  Rhythm:Regular Rate:Normal     Neuro/Psych negative neurological ROS  negative psych ROS   GI/Hepatic Neg liver ROS, GERD-  Medicated and Controlled,  Endo/Other  negative endocrine ROS  Renal/GU negative Renal ROS  negative genitourinary   Musculoskeletal negative musculoskeletal ROS (+)   Abdominal   Peds  Hematology negative hematology ROS (+)   Anesthesia Other Findings   Reproductive/Obstetrics (+) Pregnancy                           Anesthesia Physical Anesthesia Plan  ASA: II  Anesthesia Plan: Epidural   Post-op Pain Management:    Induction:   Airway Management Planned: Natural Airway  Additional Equipment:   Intra-op Plan:   Post-operative Plan:   Informed Consent: I have reviewed the patients History and Physical, chart, labs and discussed the procedure including the risks, benefits and alternatives for the proposed anesthesia with the patient or authorized representative who has indicated his/her understanding and acceptance.     Plan Discussed with: Anesthesiologist  Anesthesia Plan Comments:         Anesthesia Quick Evaluation

## 2014-02-22 NOTE — Progress Notes (Signed)
Patient ID: Laura Little, female   DOB: 23-Dec-1990, 23 y.o.   MRN: [id number]  No c/o's.  H&P reviewed, no changes  AFVSS gen NAD FHTs  130's, reactive, good var, category 1 toco irr, some irritability  AROM for clear fluid w/o diff/comp 2.3/50/-1  23yo G2P1001 at 39+ for IOL IOL with AROM/pitocin Expect SVD

## 2014-02-22 NOTE — Lactation Note (Signed)
This note was copied from the chart of Laura Lailani Dicicco. Lactation Consultation Note Initial visit at 6 hours of age.  Baby has had 4 good feedings, mom reports a little nipple pain.  Discussed latch and encouraged EBM to nipple instead of lanolin lotion.  Discussed milk supply, pumping, pacifier use, feeding frequency and hand expression.  Mom has visible colostrum and adequate breast anatomy.  [place] lC resources given and discussed.  Mom to call for assist as needed.   Patient Name: Laura Little [id number] Date: 02/22/2014 Reason for consult: Initial assessment   Maternal Data Has patient been taught Hand Expression?: Yes Does the patient have breastfeeding experience prior to this delivery?: Yes  Feeding Feeding Type: Breast Fed Length of feed: 25 min  LATCH Score/Interventions                      Lactation Tools Discussed/Used     Consult Status Consult Status: Follow-up Date: 02/23/14 Follow-up type: In-patient    Kendell Bane Justine Null 02/22/2014, 10:53 PM

## 2014-02-23 LAB — CBC
HCT: 30.7 % — ABNORMAL LOW (ref 36.0–46.0)
HEMOGLOBIN: 10.2 g/dL — AB (ref 12.0–15.0)
MCH: 29.8 pg (ref 26.0–34.0)
MCHC: 33.2 g/dL (ref 30.0–36.0)
MCV: 89.8 fL (ref 78.0–100.0)
Platelets: 260 10*3/uL (ref 150–400)
RBC: 3.42 MIL/uL — ABNORMAL LOW (ref 3.87–5.11)
RDW: 13.5 % (ref 11.5–15.5)
WBC: 14.3 10*3/uL — ABNORMAL HIGH (ref 4.0–10.5)

## 2014-02-23 MED ORDER — PRENATAL MULTIVITAMIN CH: 1.0000 | ORAL_TABLET | Freq: Every day | ORAL | Status: DC

## 2014-02-23 MED ORDER — IBUPROFEN 800 MG PO TABS: 800.0000 mg | ORAL_TABLET | Freq: Four times a day (QID) | ORAL | Status: DC

## 2014-02-23 MED ORDER — OXYCODONE-ACETAMINOPHEN 5-325 MG PO TABS: 1.0000 | ORAL_TABLET | Freq: Four times a day (QID) | ORAL | Status: DC | PRN

## 2014-02-23 NOTE — Anesthesia Postprocedure Evaluation (Signed)
Anesthesia Post Note  Patient: Laura Little  Procedure(s) Performed: * No procedures listed *  Anesthesia type: Epidural  Patient location: Mother/Baby  Post pain: Pain level controlled  Post assessment: Post-op Vital signs reviewed  Last Vitals:  Filed Vitals:   02/23/14 0804  BP: 113/62  Pulse: 63  Temp: 36.6 C  Resp: 20    Post vital signs: Reviewed  Level of consciousness:alert  Complications: No apparent anesthesia complications

## 2014-02-23 NOTE — Discharge Summary (Signed)
Obstetric Discharge Summary Reason for Admission: induction of labor Prenatal Procedures: none Intrapartum Procedures: spontaneous vaginal delivery Postpartum Procedures: none Complications-Operative and Postpartum: 2nd  degree perineal laceration Hemoglobin  Date Value Ref Range Status  02/23/2014 10.2* 12.0 - 15.0 g/dL Final     HCT  Date Value Ref Range Status  02/23/2014 30.7* 36.0 - 46.0 % Final    Physical Exam:  General: alert and no distress Lochia: appropriate Uterine Fundus: firm  Discharge Diagnoses: Term Pregnancy-delivered  Discharge Information: Date: 02/23/2014 Activity: pelvic rest Diet: routine Medications: PNV, Ibuprofen and Percocet Condition: stable Instructions: refer to practice specific booklet Discharge to: home Follow-up Information   Follow up with Stuckert, Thornell Sartorius, MD. Schedule an appointment as soon as possible for a visit in 6 weeks. (For PP check)    Specialty:  Obstetrics and Gynecology   Contact information:   [address] [address] [phone]       Newborn Data: Live born female  Birth Weight: 8 lb 1.8 oz (3680 g) APGAR: 8, 9  Home with mother.  Stuckert, [place] Bovard 02/23/2014, 11:06 AM

## 2014-02-23 NOTE — Lactation Note (Signed)
This note was copied from the chart of Laura Dimitria Ketchum. Lactation Consultation Note  Patient Name: Laura Little [id number] Date: 02/23/2014 Reason for consult: Follow-up assessment;Breast/nipple pain Mom called for assist with latching baby to right breast. She has some cracking on the right nipple, no bleeding observed. The left nipple is intact. Assisted Mom with positioning and obtaining good depth with latch. Mom reports less discomfort. Care for sore nipples reviewed. Comfort gels given with instructions. Engorgement care reviewed, advised of OP services and support group. BF basics reviewed.   Maternal Data    Feeding Feeding Type: Breast Fed Length of feed: 20 min  LATCH Score/Interventions Latch: Grasps breast easily, tongue down, lips flanged, rhythmical sucking.  Audible Swallowing: A few with stimulation  Type of Nipple: Everted at rest and after stimulation  Comfort (Breast/Nipple): Filling, red/small blisters or bruises, mild/mod discomfort  Problem noted: Mild/Moderate discomfort;Cracked, bleeding, blisters, bruises (right nipple sore, some cracking noted) Interventions (Mild/moderate discomfort): Comfort gels  Hold (Positioning): Assistance needed to correctly position infant at breast and maintain latch. Intervention(s): Breastfeeding basics reviewed;Support Pillows;Position options;Skin to skin  LATCH Score: 7  Lactation Tools Discussed/Used Tools: Comfort gels   Consult Status Consult Status: Complete Date: 02/23/14 Follow-up type: In-patient    Katrine Coho 02/23/2014, 12:30 PM

## 2014-02-23 NOTE — Progress Notes (Addendum)
Post Partum Day 1 Subjective: no complaints, up ad lib, voiding and tolerating PO  Objective: Blood pressure 118/63, pulse 80, temperature 98.1 F (36.7 C), temperature source Oral, resp. rate 20, height 5\' 2"  (1.575 m), weight 59.875 kg (132 lb), last menstrual period 06/02/2013, SpO2 100.00%, unknown if currently breastfeeding.  Physical Exam:  General: alert and no distress Lochia: appropriate Uterine Fundus: firm   Recent Labs  02/22/14 0820 02/23/14 0644  HGB 10.3* 10.2*  HCT 31.4* 30.7*    Assessment/Plan: Discharge home, Breastfeeding and Lactation consult.  Routine care.  Pt desires early discharge, will d/c with Motrin/percocet, PNV.  F/u 6 weeks   LOS: 1 day   Stuckert, Drusilla [place] Bovard 02/23/2014, 7:29 AM

## 2014-09-27 ENCOUNTER — Encounter (HOSPITAL_COMMUNITY): Payer: Self-pay | Admitting: *Deleted

## 2016-07-24 ENCOUNTER — Other Ambulatory Visit: Payer: Self-pay | Admitting: Obstetrics and Gynecology

## 2016-07-24 DIAGNOSIS — N63 Unspecified lump in unspecified breast: Secondary | ICD-10-CM

## 2016-08-02 ENCOUNTER — Ambulatory Visit
Admission: RE | Admit: 2016-08-02 | Discharge: 2016-08-02 | Disposition: A | Discharge status: Home / Self Care | Payer: BLUE CROSS/BLUE SHIELD | Source: Ambulatory Visit | Attending: Obstetrics and Gynecology | Admitting: Obstetrics and Gynecology

## 2016-08-02 DIAGNOSIS — N63 Unspecified lump in unspecified breast: Secondary | ICD-10-CM

## 2018-01-20 DIAGNOSIS — Z681 Body mass index (BMI) 19 or less, adult: Secondary | ICD-10-CM | POA: Diagnosis not present

## 2018-01-20 DIAGNOSIS — J301 Allergic rhinitis due to pollen: Secondary | ICD-10-CM | POA: Diagnosis not present

## 2018-01-20 DIAGNOSIS — R51 Headache: Secondary | ICD-10-CM | POA: Diagnosis not present

## 2018-09-10 DIAGNOSIS — Z6820 Body mass index (BMI) 20.0-20.9, adult: Secondary | ICD-10-CM | POA: Diagnosis not present

## 2018-09-10 DIAGNOSIS — R220 Localized swelling, mass and lump, head: Secondary | ICD-10-CM | POA: Diagnosis not present

## 2018-09-18 ENCOUNTER — Ambulatory Visit (INDEPENDENT_AMBULATORY_CARE_PROVIDER_SITE_OTHER): Payer: BLUE CROSS/BLUE SHIELD | Admitting: General Surgery

## 2018-09-18 ENCOUNTER — Encounter: Payer: Self-pay | Admitting: General Surgery

## 2018-09-18 VITALS — BP 116/69 | HR 66 | Temp 98.4°F | Resp 18 | Wt 118.0 lb

## 2018-09-18 DIAGNOSIS — L723 Sebaceous cyst: Secondary | ICD-10-CM | POA: Diagnosis not present

## 2018-09-18 NOTE — Progress Notes (Signed)
[facility] History and Physical  Reason for Referral: Mass on scalp  Referring Physician: Lanelle Bal PA   Laura Little is a 27 y.o. female.  HPI: Laura Little is a very pleasant 27 yo with new findings of a mass/ bump on the top of her left scalp. She has been having migraines for about 1 year and has been rubbing her head and in the past few months she noticed this bump.  She observed it for a period of time, and then had her PCP look at the lesion.  She describes it as tender to touch and worries that it could be contributing to her headaches. She is also concerned because she feels that it is not well circumscribed and is oddly shaped, which she would not expect for a simple cyst.  She was sent over for further evaluation.  She denies any redness or drainage or infections, and does not have other lesions.    Past Medical History:  Diagnosis Date  . LOC (loss of consciousness) ([place]) 02/21/2014  . No pertinent past medical history   . Normal pregnancy, repeat 02/21/2014  . SVD (spontaneous vaginal delivery) 02/22/2014    Past Surgical History:  Procedure Laterality Date  . NO PAST SURGERIES      Family History  Problem Relation Age of Onset  . Hypertension Paternal Grandfather   . Heart attack Paternal Grandfather     Social History   Tobacco Use  . Smoking status: Never Smoker  . Smokeless tobacco: Never Used  Substance Use Topics  . Alcohol use: No  . Drug use: No    Medications: I have reviewed the patient's current medications. Allergies as of 09/18/2018   No Known Allergies     Medication List    as of 09/18/2018  3:53 PM   You have not been prescribed any medications.      ROS:  A comprehensive review of systems was negative except for: Ears, nose, mouth, throat, and face: positive for sinus problems, mass on left scalp Neurological: positive for headaches  Blood pressure 116/69, pulse 66, temperature 98.4 F (36.9 C), temperature  source Temporal, resp. rate 18, weight 118 lb (53.5 kg), unknown if currently breastfeeding. Physical Exam  Constitutional: She is oriented to person, place, and time. She appears well-developed and well-nourished.  HENT:  Head: Normocephalic and atraumatic.  Left posterior scalp area, 1cm mass difficult to assess every margin, tender, no central pit noted, no signs of drainage  Eyes: Pupils are equal, round, and reactive to light. EOM are normal.  Neck: Normal range of motion.  Cardiovascular: Normal rate and regular rhythm.  Pulmonary/Chest: Effort normal and breath sounds normal.  Abdominal: Soft. She exhibits no distension. There is no tenderness.  Musculoskeletal: Normal range of motion. She exhibits no edema.  Neurological: She is alert and oriented to person, place, and time.  Skin: Skin is warm and dry.  Psychiatric: She has a normal mood and affect. Her behavior is normal. Judgment and thought content normal.  Vitals reviewed.   Results: None   Assessment & Plan:  Laura Little is a 27 y.o. female with a mass on her scalp which is likely an epidermal cyst/ sebaceous cyst given the location.  The patient is otherwise healthy and has no other issues asides from her headaches. We discussed that this could also be something like a lipoma.  Overall, I am not overly concerned with the mass but if it is causing her discomfort  and worry, then it is appropriate to get it removed.  We have discussed that it is difficult to say if it could have anything to do with her headaches but that some tension headaches can be caused from muscle pain/ tension, and I would assume pressure or tension on a superficial nerve could also refer pain and potentially cause a headache but this could not be proven or disproven.  Likely this is a cyst and given the thickness of the scalp we are not feeling the entire borders. This explanation gave the patient some relief, but she would prefer to proceed with removal.     -Excision of the cyst in the minor procedure room with local anesthetic   All questions were answered to the satisfaction of the patient.  The risk and benefits of excision of the cyst were discussed including but not limited to bleeding, infection, need to shave/ trim hair, risk of recurrence, potential need to send to pathology if not consistent with simple cyst.  After careful consideration, Laura Little has decided to proceed.    Virl Cagey 09/18/2018, 3:53 PM

## 2018-09-18 NOTE — Patient Instructions (Signed)
Call once you decide if you want to get it removed or if growing/ symptoms worsening.   Epidermal Cyst (SEBACOUES CYST) An epidermal cyst is sometimes called an epidermal inclusion cyst or an infundibular cyst. It is a sac made of skin tissue. The sac contains a substance called keratin. Keratin is a protein that is normally secreted through the hair follicles. When keratin becomes trapped in the top layer of skin (epidermis), it can form an epidermal cyst. Epidermal cysts are usually found on the face, neck, trunk, and genitals. These cysts are usually harmless (benign), and they may not cause symptoms unless they become infected. It is important not to pop epidermal cysts yourself. What are the causes? This condition may be caused by:  A blocked hair follicle.  A hair that curls and re-enters the skin instead of growing straight out of the skin (ingrown hair).  A blocked pore.  Irritated skin.  An injury to the skin.  Certain conditions that are passed along from parent to child (inherited).  Human papillomavirus (HPV).  What increases the risk? The following factors may make you more likely to develop an epidermal cyst:  Having acne.  Being overweight.  Wearing tight clothing.  What are the signs or symptoms? The only symptom of this condition may be a small, painless lump underneath the skin. When an epidermal cyst becomes infected, symptoms may include:  Redness.  Inflammation.  Tenderness.  Warmth.  Fever.  Keratin draining from the cyst. Keratin may look like a grayish-white, bad-smelling substance.  Pus draining from the cyst.  How is this diagnosed? This condition is diagnosed with a physical exam. In some cases, you may have a sample of tissue (biopsy) taken from your cyst to be examined under a microscope or tested for bacteria. You may be referred to a health care provider who specializes in skin care (dermatologist). How is this treated? In many cases,  epidermal cysts go away on their own without treatment. If a cyst becomes infected, treatment may include:  Opening and draining the cyst. After draining, minor surgery to remove the rest of the cyst may be done.  Antibiotic medicine to help prevent infection.  Injections of medicines (steroids) that help to reduce inflammation.  Surgery to remove the cyst. Surgery may be done if: ? The cyst becomes large. ? The cyst bothers you. ? There is a chance that the cyst could turn into cancer.  Follow these instructions at home:  Take over-the-counter and prescription medicines only as told by your health care provider.  If you were prescribed an antibiotic, use it as told by your health care provider. Do not stop using the antibiotic even if you start to feel better.  Keep the area around your cyst clean and dry.  Wear loose, dry clothing.  Do not try to pop your cyst.  Avoid touching your cyst.  Check your cyst every day for signs of infection.  Keep all follow-up visits as told by your health care provider. This is important. How is this prevented?  Wear clean, dry, clothing.  Avoid wearing tight clothing.  Keep your skin clean and dry. Shower or take baths every day.  Wash your body with a benzoyl peroxide wash when you shower or bathe. Contact a health care provider if:  Your cyst develops symptoms of infection.  Your condition is not improving or is getting worse.  You develop a cyst that looks different from other cysts you have had.  You have  a fever. Get help right away if:  Redness spreads from the cyst into the surrounding area. This information is not intended to replace advice given to you by your health care provider. Make sure you discuss any questions you have with your health care provider. Document Released: 10/13/2004 Document Revised: 07/11/2016 Document Reviewed: 09/14/2015 Elsevier Interactive Patient Education  2018 [employer].  Lipoma A  lipoma is a noncancerous (benign) tumor that is made up of fat cells. This is a very common type of soft-tissue growth. Lipomas are usually found under the skin (subcutaneous). They may occur in any tissue of the body that contains fat. Common areas for lipomas to appear include the back, shoulders, buttocks, and thighs. Lipomas grow slowly, and they are usually painless. Most lipomas do not cause problems and do not require treatment. What are the causes? The cause of this condition is not known. What increases the risk? This condition is more likely to develop in:  People who are 16-66 years old.  People who have a family history of lipomas.  What are the signs or symptoms? A lipoma usually appears as a small, round bump under the skin. It may feel soft or rubbery, but the firmness can vary. Most lipomas are not painful. However, a lipoma may become painful if it is located in an area where it pushes on nerves. How is this diagnosed? A lipoma can usually be diagnosed with a physical exam. You may also have tests to confirm the diagnosis and to rule out other conditions. Tests may include:  Imaging tests, such as a CT scan or MRI.  Removal of a tissue sample to be looked at under a microscope (biopsy).  How is this treated? Treatment is not needed for small lipomas that are not causing problems. If a lipoma continues to get bigger or it causes problems, removal is often the best option. Lipomas can also be removed to improve appearance. Removal of a lipoma is usually done with a surgery in which the fatty cells and the surrounding capsule are removed. Most often, a medicine that numbs the area (local anesthetic) is used for this procedure. Follow these instructions at home:  Keep all follow-up visits as directed by your health care provider. This is important. Contact a health care provider if:  Your lipoma becomes larger or hard.  Your lipoma becomes painful, red, or increasingly  swollen. These could be signs of infection or a more serious condition. This information is not intended to replace advice given to you by your health care provider. Make sure you discuss any questions you have with your health care provider. Document Released: 11/02/2002 Document Revised: 04/19/2016 Document Reviewed: 11/08/2014 Elsevier Interactive Patient Education  [employer].

## 2018-09-23 NOTE — H&P (Signed)
[facility] History and Physical  Reason for Referral: Mass on scalp  Referring Physician: Lanelle Bal PA  Laura Little is a 27 y.o. female.  HPI: Laura Little is a very pleasant 27 yo with new findings of a mass/ bump on the top of her left scalp. She has been having migraines for about 1 year and has been rubbing her head and in the past few months she noticed this bump.  She observed it for a period of time, and then had her PCP look at the lesion.  She describes it as tender to touch and worries that it could be contributing to her headaches. She is also concerned because she feels that it is not well circumscribed and is oddly shaped, which she would not expect for a simple cyst.  She was sent over for further evaluation.  She denies any redness or drainage or infections, and does not have other lesions.        Past Medical History:  Diagnosis Date  . LOC (loss of consciousness) ([place]) 02/21/2014  . No pertinent past medical history   . Normal pregnancy, repeat 02/21/2014  . SVD (spontaneous vaginal delivery) 02/22/2014         Past Surgical History:  Procedure Laterality Date  . NO PAST SURGERIES           Family History  Problem Relation Age of Onset  . Hypertension Paternal Grandfather   . Heart attack Paternal Grandfather     Social History       Tobacco Use  . Smoking status: Never Smoker  . Smokeless tobacco: Never Used  Substance Use Topics  . Alcohol use: No  . Drug use: No    Medications: I have reviewed the patient's current medications. Allergies as of 09/18/2018   No Known Allergies     Medication List    as of 09/18/2018  3:53 PM   You have not been prescribed any medications.      ROS:  A comprehensive review of systems was negative except for: Ears, nose, mouth, throat, and face: positive for sinus problems, mass on left scalp Neurological: positive for headaches  Blood pressure 116/69, pulse  66, temperature 98.4 F (36.9 C), temperature source Temporal, resp. rate 18, weight 118 lb (53.5 kg), unknown if currently breastfeeding. Physical Exam  Constitutional: She is oriented to person, place, and time. She appears well-developed and well-nourished.  HENT:  Head: Normocephalic and atraumatic.  Left posterior scalp area, 1cm mass difficult to assess every margin, tender, no central pit noted, no signs of drainage  Eyes: Pupils are equal, round, and reactive to light. EOM are normal.  Neck: Normal range of motion.  Cardiovascular: Normal rate and regular rhythm.  Pulmonary/Chest: Effort normal and breath sounds normal.  Abdominal: Soft. She exhibits no distension. There is no tenderness.  Musculoskeletal: Normal range of motion. She exhibits no edema.  Neurological: She is alert and oriented to person, place, and time.  Skin: Skin is warm and dry.  Psychiatric: She has a normal mood and affect. Her behavior is normal. Judgment and thought content normal.  Vitals reviewed.   Results: None   Assessment & Plan:  Laura Little is a 27 y.o. female with a mass on her scalp which is likely an epidermal cyst/ sebaceous cyst given the location.  The patient is otherwise healthy and has no other issues asides from her headaches. We discussed that this could also be something like a lipoma.  Overall, I am not overly concerned with the mass but if it is causing her discomfort and worry, then it is appropriate to get it removed.  We have discussed that it is difficult to say if it could have anything to do with her headaches but that some tension headaches can be caused from muscle pain/ tension, and I would assume pressure or tension on a superficial nerve could also refer pain and potentially cause a headache but this could not be proven or disproven.  Likely this is a cyst and given the thickness of the scalp we are not feeling the entire borders. This explanation gave the patient some  relief, but she would prefer to proceed with removal.    -Excision of the cyst in the minor procedure room with local anesthetic   All questions were answered to the satisfaction of the patient.  The risk and benefits of excision of the cyst were discussed including but not limited to bleeding, infection, need to shave/ trim hair, risk of recurrence, potential need to send to pathology if not consistent with simple cyst.  After careful consideration, Laura Little has decided to proceed.    Laura Little 09/18/2018, 3:53 PM

## 2018-10-10 ENCOUNTER — Ambulatory Visit (HOSPITAL_COMMUNITY)
Admission: RE | Admit: 2018-10-10 | Discharge: 2018-10-10 | Disposition: A | Discharge status: Home / Self Care | Payer: BLUE CROSS/BLUE SHIELD | Source: Ambulatory Visit | Attending: General Surgery | Admitting: General Surgery

## 2018-10-10 ENCOUNTER — Encounter (HOSPITAL_COMMUNITY)
Admission: RE | Disposition: A | Discharge status: Home / Self Care | Payer: Self-pay | Source: Ambulatory Visit | Attending: General Surgery

## 2018-10-10 ENCOUNTER — Encounter (HOSPITAL_COMMUNITY): Payer: Self-pay

## 2018-10-10 DIAGNOSIS — D234 Other benign neoplasm of skin of scalp and neck: Secondary | ICD-10-CM | POA: Diagnosis not present

## 2018-10-10 DIAGNOSIS — L723 Sebaceous cyst: Secondary | ICD-10-CM | POA: Diagnosis not present

## 2018-10-10 DIAGNOSIS — M7989 Other specified soft tissue disorders: Secondary | ICD-10-CM | POA: Diagnosis not present

## 2018-10-10 DIAGNOSIS — Z8249 Family history of ischemic heart disease and other diseases of the circulatory system: Secondary | ICD-10-CM | POA: Insufficient documentation

## 2018-10-10 HISTORY — PX: LIPOMA EXCISION: SHX5283

## 2018-10-10 SURGERY — MINOR EXCISION LIPOMA
Anesthesia: LOCAL

## 2018-10-10 MED ORDER — LIDOCAINE HCL (PF) 1 % IJ SOLN
INTRAMUSCULAR | Status: DC | PRN
  Administered 2018-10-10: 5 mL

## 2018-10-10 MED ORDER — LIDOCAINE HCL (PF) 1 % IJ SOLN
INTRAMUSCULAR | Status: AC
  Filled 2018-10-10: qty 30

## 2018-10-10 MED ORDER — OXYCODONE HCL 5 MG PO TABS: 5.0000 mg | ORAL_TABLET | ORAL | 0 refills | Status: DC | PRN

## 2018-10-10 MED ORDER — BACITRACIN ZINC 500 UNIT/GM EX OINT
TOPICAL_OINTMENT | CUTANEOUS | Status: AC
  Filled 2018-10-10: qty 0.9

## 2018-10-10 SURGICAL SUPPLY — 18 items
DECANTER SPIKE VIAL GLASS SM (MISCELLANEOUS) ×2 IMPLANT
DERMABOND ADVANCED (GAUZE/BANDAGES/DRESSINGS) ×1
DERMABOND ADVANCED .7 DNX12 (GAUZE/BANDAGES/DRESSINGS) ×1 IMPLANT
DURAPREP 26ML APPLICATOR (WOUND CARE) ×2 IMPLANT
ELECT NEEDLE TIP 2.8 STRL (NEEDLE) IMPLANT
GLOVE BIO SURGEON STRL SZ 6.5 (GLOVE) ×2 IMPLANT
GLOVE BIOGEL PI IND STRL 6.5 (GLOVE) ×1 IMPLANT
GLOVE BIOGEL PI IND STRL 7.0 (GLOVE) ×1 IMPLANT
GLOVE BIOGEL PI INDICATOR 6.5 (GLOVE) ×1
GLOVE BIOGEL PI INDICATOR 7.0 (GLOVE) ×1
NEEDLE HYPO 25X1 1.5 SAFETY (NEEDLE) IMPLANT
NS IRRIG 1000ML POUR BTL (IV SOLUTION) IMPLANT
SUT ETHILON 3 0 FSL (SUTURE) IMPLANT
SUT MNCRL AB 4-0 PS2 18 (SUTURE) ×2 IMPLANT
SUT PROLENE 4 0 PS 2 18 (SUTURE) IMPLANT
SUT VIC AB 3-0 SH 27 (SUTURE)
SUT VIC AB 3-0 SH 27X BRD (SUTURE) IMPLANT
SYR CONTROL 10ML LL (SYRINGE) ×2 IMPLANT

## 2018-10-10 NOTE — Interval H&P Note (Signed)
History and Physical Interval Note:  10/10/2018 8:52 AM  Laura Little  has presented today for surgery, with the diagnosis of sebaceous cyst on scalp  The various methods of treatment have been discussed with the patient and family. After consideration of risks, benefits and other options for treatment, the patient has consented to  Procedure(s): MINOR EXCISION SEBACEOUS 1CM CYST ON SCALP (N/A) as a surgical intervention .  The patient's history has been reviewed, patient examined, no change in status, stable for surgery.  I have reviewed the patient's chart and labs.  Questions were answered to the patient's satisfaction.    No questions.   Virl Cagey

## 2018-10-10 NOTE — Discharge Instructions (Signed)
Discharge Instructions: Shower per your regular routine.  Bacitracin to the area daily.  Take tylenol and ibuprofen as needed for pain control, alternating every 4-6 hours.  Take Roxicodone for breakthrough pain. Take over the counter colace for constipation related to narcotic pain medication. Some minor drainage is possible.

## 2018-10-10 NOTE — Op Note (Addendum)
[facility] Operative Note  10/10/18  Preoperative Diagnosis: Cyst on scalp    Postoperative Diagnosis: Same   Procedure(s) Performed: Excision of cyst (<1cm)   Surgeon: Lanell Matar. Constance Haw, MD   Assistants: No qualified resident was available    Anesthesia: 1% lidocaine    Specimens: Cyst    Estimated Blood Loss: Minimal   Wound Class:Clean    Operative Indications: Ms. Nored is a 27 yo with what is likely a dermoid/ sebaceous cyst on her scalp.  This bothers her and she is worried about it being something more than a cyst. When she combs her hair she notices it, and when she has headaches she feels extra pressure in the area.  Given her concerns, and the discomfort we have opted to remove it.   Findings: Small < 1cm ruptured cyst    Procedure: The patient was taken to the procedure room and was sat at a 45 degree angle to expose the back top of the scalp.  The area was cleaned and 1% lidocaine was injected. The area was the prepped and draped, and an incision was made of the cyst. Hemostats and metzenbalm scissors were used to excise the cyst. Pressure was held to control bleeding. A deep 3-0 Vicryl suture was placed and 2 interrupted 4-0 Prolene sutures were placed. The area was cleaned and bacitracin was applied.   Final inspection revealed acceptable hemostasis. The patient tolerated the procedure well.    Curlene Labrum, MD [facility] [address] [place], [place] [address] [phone] (office)

## 2018-10-15 ENCOUNTER — Encounter (HOSPITAL_COMMUNITY): Payer: Self-pay | Admitting: General Surgery

## 2018-10-16 ENCOUNTER — Ambulatory Visit (INDEPENDENT_AMBULATORY_CARE_PROVIDER_SITE_OTHER): Payer: Self-pay | Admitting: General Surgery

## 2018-10-16 ENCOUNTER — Encounter: Payer: Self-pay | Admitting: General Surgery

## 2018-10-16 VITALS — BP 115/65 | HR 77 | Temp 98.6°F | Resp 18 | Wt 110.8 lb

## 2018-10-16 DIAGNOSIS — L723 Sebaceous cyst: Secondary | ICD-10-CM

## 2018-10-16 NOTE — Progress Notes (Signed)
[facility] Note   HPI:  27 y.o. Female presents to clinic for post-op follow-up evaluation after excision of a cyst from her scalp. Patient reports some soreness but overall doing well.  Review of Systems:  No fever or chills No drainage All other review of systems: otherwise negative   Clinical: Diagnosis Cyst, excision, scalp - FIBROADIPOSE TISSUE WITH FOCAL SQUAMOUS EPITHELIUM AND CHRONIC INFLAMMATION - NO MALIGNANCY IDENTIFIED  Vital Signs:  BP 115/65 (BP Location: Left Arm, Patient Position: Sitting, Cuff Size: Normal)   Pulse 77   Temp 98.6 F (37 C) (Temporal)   Resp 18   Wt 110 lb 12.8 oz (50.3 kg)   BMI 20.27 kg/m    Physical Exam:  Physical Exam  HENT:  Head: Normocephalic.  Healed incision on scalp, sutures removed  Pulmonary/Chest: Effort normal.    Assessment:  27 y.o. yo Female s/p excision of cyst from scalp. Doing well.  Plan:  - Follow up PRN  - Bacitracin to area as desired   All of the above recommendations were discussed with the patient, and all of patient's questions were answered to her expressed satisfaction.  Curlene Labrum, MD [facility] [address] [place], [place] [address] [phone] (office)

## 2018-10-16 NOTE — Patient Instructions (Signed)
Bacitracin to area as needed.

## 2018-11-21 DIAGNOSIS — M79675 Pain in left toe(s): Secondary | ICD-10-CM | POA: Diagnosis not present

## 2018-11-21 DIAGNOSIS — Z6820 Body mass index (BMI) 20.0-20.9, adult: Secondary | ICD-10-CM | POA: Diagnosis not present

## 2019-02-10 ENCOUNTER — Other Ambulatory Visit: Payer: Self-pay | Admitting: Obstetrics and Gynecology

## 2019-02-10 DIAGNOSIS — N644 Mastodynia: Secondary | ICD-10-CM

## 2019-02-16 ENCOUNTER — Ambulatory Visit
Admission: RE | Admit: 2019-02-16 | Discharge: 2019-02-16 | Disposition: A | Discharge status: Home / Self Care | Payer: BLUE CROSS/BLUE SHIELD | Source: Ambulatory Visit | Attending: Obstetrics and Gynecology | Admitting: Obstetrics and Gynecology

## 2019-02-16 ENCOUNTER — Other Ambulatory Visit: Payer: Self-pay

## 2019-02-16 DIAGNOSIS — N644 Mastodynia: Secondary | ICD-10-CM | POA: Diagnosis not present

## 2019-05-01 DIAGNOSIS — Z681 Body mass index (BMI) 19 or less, adult: Secondary | ICD-10-CM | POA: Diagnosis not present

## 2019-05-01 DIAGNOSIS — N3001 Acute cystitis with hematuria: Secondary | ICD-10-CM | POA: Diagnosis not present

## 2019-05-13 DIAGNOSIS — N3001 Acute cystitis with hematuria: Secondary | ICD-10-CM | POA: Diagnosis not present

## 2019-05-13 DIAGNOSIS — Z6820 Body mass index (BMI) 20.0-20.9, adult: Secondary | ICD-10-CM | POA: Diagnosis not present

## 2019-05-13 DIAGNOSIS — R300 Dysuria: Secondary | ICD-10-CM | POA: Diagnosis not present

## 2019-06-11 DIAGNOSIS — R310 Gross hematuria: Secondary | ICD-10-CM | POA: Diagnosis not present

## 2019-06-11 DIAGNOSIS — R3914 Feeling of incomplete bladder emptying: Secondary | ICD-10-CM | POA: Diagnosis not present

## 2019-06-11 DIAGNOSIS — N3020 Other chronic cystitis without hematuria: Secondary | ICD-10-CM | POA: Diagnosis not present

## 2019-06-11 DIAGNOSIS — R350 Frequency of micturition: Secondary | ICD-10-CM | POA: Diagnosis not present

## 2019-06-26 DIAGNOSIS — N132 Hydronephrosis with renal and ureteral calculous obstruction: Secondary | ICD-10-CM | POA: Diagnosis not present

## 2019-06-26 DIAGNOSIS — R310 Gross hematuria: Secondary | ICD-10-CM | POA: Diagnosis not present

## 2019-07-03 DIAGNOSIS — R3915 Urgency of urination: Secondary | ICD-10-CM | POA: Diagnosis not present

## 2019-07-03 DIAGNOSIS — R350 Frequency of micturition: Secondary | ICD-10-CM | POA: Diagnosis not present

## 2019-07-03 DIAGNOSIS — N201 Calculus of ureter: Secondary | ICD-10-CM | POA: Diagnosis not present

## 2019-07-03 DIAGNOSIS — R102 Pelvic and perineal pain: Secondary | ICD-10-CM | POA: Diagnosis not present

## 2019-07-06 DIAGNOSIS — N201 Calculus of ureter: Secondary | ICD-10-CM | POA: Diagnosis not present

## 2019-07-06 DIAGNOSIS — N202 Calculus of kidney with calculus of ureter: Secondary | ICD-10-CM | POA: Diagnosis not present

## 2019-07-06 DIAGNOSIS — N200 Calculus of kidney: Secondary | ICD-10-CM | POA: Diagnosis not present

## 2019-07-14 DIAGNOSIS — N201 Calculus of ureter: Secondary | ICD-10-CM | POA: Diagnosis not present

## 2019-07-27 ENCOUNTER — Other Ambulatory Visit: Payer: Self-pay

## 2019-07-27 DIAGNOSIS — Z20822 Contact with and (suspected) exposure to covid-19: Secondary | ICD-10-CM

## 2019-07-27 DIAGNOSIS — J039 Acute tonsillitis, unspecified: Secondary | ICD-10-CM | POA: Diagnosis not present

## 2019-07-27 DIAGNOSIS — R6889 Other general symptoms and signs: Secondary | ICD-10-CM | POA: Diagnosis not present

## 2019-07-27 DIAGNOSIS — R509 Fever, unspecified: Secondary | ICD-10-CM | POA: Diagnosis not present

## 2019-07-28 DIAGNOSIS — N200 Calculus of kidney: Secondary | ICD-10-CM | POA: Diagnosis not present

## 2019-07-29 LAB — NOVEL CORONAVIRUS, NAA: SARS-CoV-2, NAA: NOT DETECTED

## 2019-08-14 DIAGNOSIS — N200 Calculus of kidney: Secondary | ICD-10-CM | POA: Diagnosis not present

## 2019-08-26 ENCOUNTER — Other Ambulatory Visit: Payer: Self-pay

## 2019-08-26 DIAGNOSIS — Z20822 Contact with and (suspected) exposure to covid-19: Secondary | ICD-10-CM

## 2019-08-26 DIAGNOSIS — J039 Acute tonsillitis, unspecified: Secondary | ICD-10-CM | POA: Diagnosis not present

## 2019-08-27 DIAGNOSIS — R6889 Other general symptoms and signs: Secondary | ICD-10-CM | POA: Diagnosis not present

## 2019-08-28 LAB — NOVEL CORONAVIRUS, NAA: SARS-CoV-2, NAA: NOT DETECTED

## 2019-11-10 DIAGNOSIS — R519 Headache, unspecified: Secondary | ICD-10-CM | POA: Diagnosis not present

## 2019-11-10 DIAGNOSIS — Z20828 Contact with and (suspected) exposure to other viral communicable diseases: Secondary | ICD-10-CM | POA: Diagnosis not present

## 2019-11-12 ENCOUNTER — Other Ambulatory Visit: Payer: BC Managed Care – PPO

## 2019-12-31 ENCOUNTER — Other Ambulatory Visit: Payer: Self-pay

## 2019-12-31 ENCOUNTER — Encounter: Payer: Self-pay | Admitting: General Surgery

## 2019-12-31 ENCOUNTER — Ambulatory Visit: Payer: BC Managed Care – PPO | Admitting: General Surgery

## 2019-12-31 VITALS — BP 121/75 | HR 78 | Temp 98.5°F | Resp 16 | Ht 62.0 in | Wt 111.0 lb

## 2019-12-31 DIAGNOSIS — L723 Sebaceous cyst: Secondary | ICD-10-CM

## 2020-01-04 NOTE — Progress Notes (Signed)
[facility] Note   HPI:  29 y.o. Female presents to clinic for follow-up evaluation of incision on her scalp where I removed a ruptured cyst. She feels the area is getting more prominent and harder. She notices it with brushing her hair.   Review of Systems:  Tenderness over incision of left posterior scalp All other review of systems: otherwise negative   Vital Signs:  BP 121/75 (BP Location: Left Arm, Patient Position: Sitting, Cuff Size: Normal)   Pulse 78   Temp 98.5 F (36.9 C) (Oral)   Resp 16   Ht 5\' 2"  (1.575 m)   Wt 111 lb (50.3 kg)   SpO2 99%   BMI 20.30 kg/m    Physical Exam:  Physical Exam Vitals reviewed.  HENT:     Head: Normocephalic.     Comments: Left posterior scalp with scar, small induration, < 21mm in size, slightly raised, minimally tender    Nose: Nose normal.  Pulmonary:     Effort: Pulmonary effort is normal.  Neurological:     Mental Status: She is alert.      Assessment:  29 y.o. yo Female with possible recurrent cyst in the area. She had findings consistent with a ruptured cyst on the pathology with inflammation and epithelium. She is at risk for recurrence and for other cysts to develop.  At this time nothing concerning and does not have to get this removed.   Plan:  - She will monitor the area for now   - Follow up PRN   All of the above recommendations were discussed with the patient, and all of patient's questions were answered to her expressed satisfaction.  Curlene Labrum, MD [facility] [address] [place], [place] [address] [phone] (office)

## 2020-01-08 DIAGNOSIS — M9901 Segmental and somatic dysfunction of cervical region: Secondary | ICD-10-CM | POA: Diagnosis not present

## 2020-01-08 DIAGNOSIS — M9902 Segmental and somatic dysfunction of thoracic region: Secondary | ICD-10-CM | POA: Diagnosis not present

## 2020-01-08 DIAGNOSIS — M546 Pain in thoracic spine: Secondary | ICD-10-CM | POA: Diagnosis not present

## 2020-01-08 DIAGNOSIS — M542 Cervicalgia: Secondary | ICD-10-CM | POA: Diagnosis not present

## 2020-01-29 ENCOUNTER — Ambulatory Visit: Payer: BC Managed Care – PPO | Attending: Internal Medicine

## 2020-01-29 DIAGNOSIS — Z23 Encounter for immunization: Secondary | ICD-10-CM | POA: Insufficient documentation

## 2020-01-29 NOTE — Progress Notes (Signed)
   Covid-19 Vaccination Clinic  Name:  Laura Little    MRN: [id number] DOB: October 14, 1991  01/29/2020  [name] was observed post Covid-19 immunization for 15 minutes without incident. She was provided with Vaccine Information Sheet and instruction to access the V-Safe system.   [name] was instructed to call 911 with any severe reactions post vaccine: Marland Kitchen Difficulty breathing  . Swelling of face and throat  . A fast heartbeat  . A bad rash all over body  . Dizziness and weakness   Immunizations Administered    Name Date Dose VIS Date Route   Pfizer COVID-19 Vaccine 01/29/2020 12:35 PM 0.3 mL 11/06/2019 Intramuscular   Manufacturer: [place]   Lot: [id number]   [place]: [id number]

## 2020-02-24 ENCOUNTER — Ambulatory Visit: Payer: BC Managed Care – PPO | Attending: Internal Medicine

## 2020-02-24 DIAGNOSIS — Z23 Encounter for immunization: Secondary | ICD-10-CM

## 2020-02-24 NOTE — Progress Notes (Signed)
   Covid-19 Vaccination Clinic  Name:  Laura Little    MRN: [id number] DOB: 05-Nov-1991  02/24/2020  [name] was observed post Covid-19 immunization for 15 minutes without incident. She was provided with Vaccine Information Sheet and instruction to access the V-Safe system.   [name] was instructed to call 911 with any severe reactions post vaccine: Marland Kitchen Difficulty breathing  . Swelling of face and throat  . A fast heartbeat  . A bad rash all over body  . Dizziness and weakness   Immunizations Administered    Name Date Dose VIS Date Route   Pfizer COVID-19 Vaccine 02/24/2020 12:46 PM 0.3 mL 11/06/2019 Intramuscular   Manufacturer: [place]   Lot: [id number]   [place]: [id number]

## 2020-02-29 IMAGING — US ULTRASOUND LEFT BREAST LIMITED
1 series · 2 of 2 positions shown · non-contrast
Comparison: None

CLINICAL DATA: 28-year-old patient with recent bilateral breast
tenderness and focal lumps, in the upper-outer right breast and
upper inner left breast. All of the symptoms have diminished after
the patient's menstrual cycle.

EXAM:
DIGITAL DIAGNOSTIC BILATERAL MAMMOGRAM WITH CAD AND TOMO
ULTRASOUND BILATERAL BREAST

[Series 1: ultrasound left breast limited · 0.06mm/px · 2 of 2 slices shown]
[im 1/2]
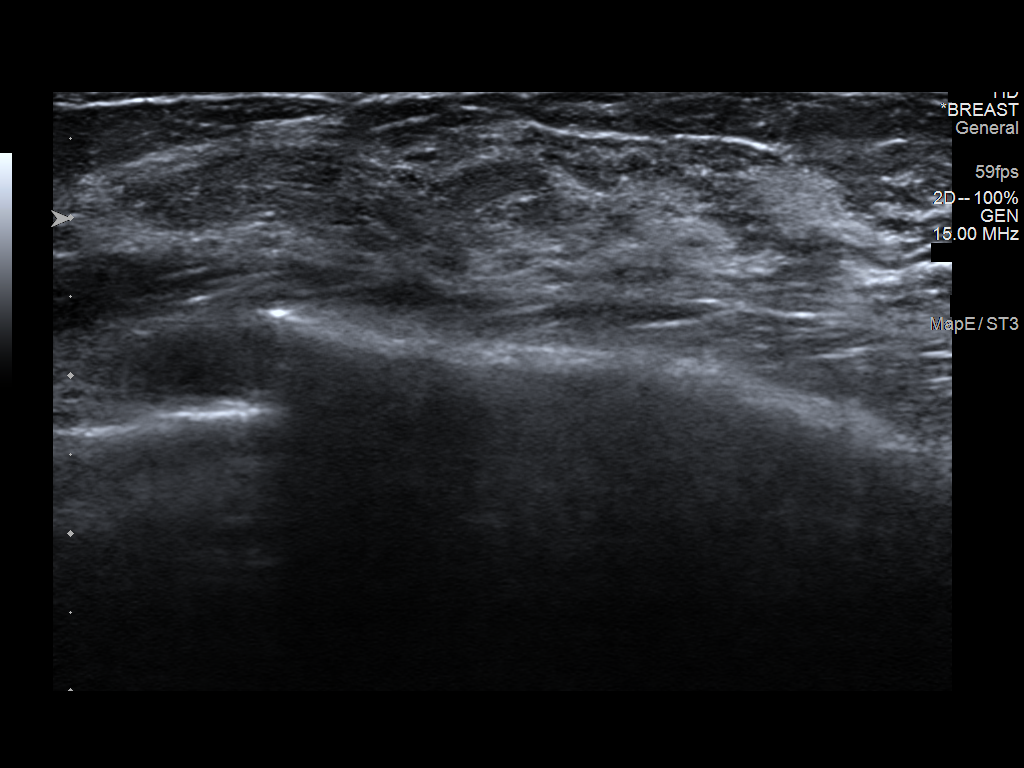
[im 2/2]
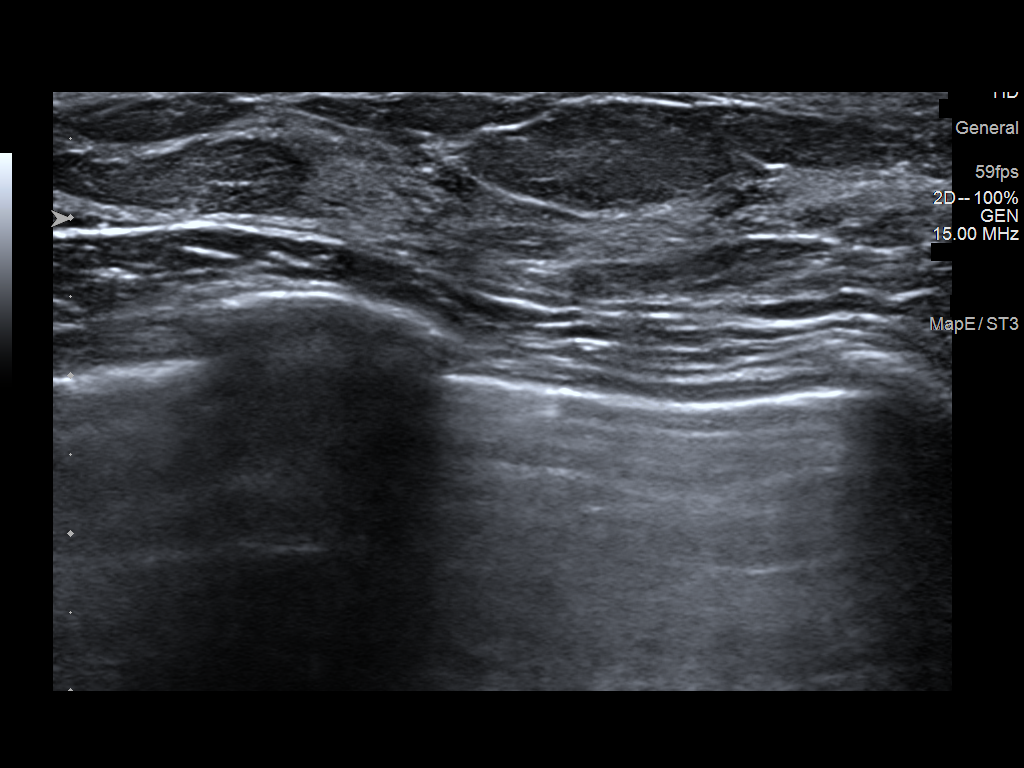

[2 of 2 positions shown; findings below may reference images not displayed]

ACR Breast Density Category c: The breast tissue is heterogeneously
dense, which may obscure small masses.
FINDINGS: No mass, architectural distortion, or suspicious microcalcification
is identified to suggest malignancy in either breast. Spot
compression views of the regions of patient concern in the
upper-outer right breast and upper inner left breast are negative.

Mammographic images were processed with CAD.

Targeted ultrasound is performed, showing normal fibroglandular
tissue is seen in the upper outer right breast and upper inner left
breast in the regions of recent patient concern. No suspicious
findings.
IMPRESSION: No evidence of malignancy in either breast. Patient has tenderness
and lumpy areas diminished after her menstrual cycle.

RECOMMENDATION:
Screening mammogram at age 38 unless there are persistent or
intervening clinical concerns. (Code:73-Q-B5U)

I have discussed the findings and recommendations with the patient.
Results were also provided in writing at the conclusion of the
visit. If applicable, a reminder letter will be sent to the patient
regarding the next appointment.

BI-RADS CATEGORY  1: Negative.

## 2020-08-17 DIAGNOSIS — Z20828 Contact with and (suspected) exposure to other viral communicable diseases: Secondary | ICD-10-CM | POA: Diagnosis not present

## 2020-08-17 DIAGNOSIS — J069 Acute upper respiratory infection, unspecified: Secondary | ICD-10-CM | POA: Diagnosis not present

## 2020-09-03 ENCOUNTER — Encounter: Payer: Self-pay | Admitting: Emergency Medicine

## 2020-09-03 ENCOUNTER — Ambulatory Visit
Admission: EM | Admit: 2020-09-03 | Discharge: 2020-09-03 | Disposition: A | Discharge status: Home / Self Care | Payer: BC Managed Care – PPO | Attending: Emergency Medicine | Admitting: Emergency Medicine

## 2020-09-03 DIAGNOSIS — N390 Urinary tract infection, site not specified: Secondary | ICD-10-CM | POA: Diagnosis not present

## 2020-09-03 DIAGNOSIS — R350 Frequency of micturition: Secondary | ICD-10-CM | POA: Diagnosis not present

## 2020-09-03 LAB — POCT URINALYSIS DIP (MANUAL ENTRY)
Bilirubin, UA: NEGATIVE
Glucose, UA: NEGATIVE mg/dL
Ketones, POC UA: NEGATIVE mg/dL
Nitrite, UA: NEGATIVE
Protein Ur, POC: NEGATIVE mg/dL
Spec Grav, UA: 1.01 (ref 1.010–1.025)
Urobilinogen, UA: 0.2 E.U./dL
pH, UA: 7 (ref 5.0–8.0)

## 2020-09-03 LAB — POCT URINE PREGNANCY: Preg Test, Ur: NEGATIVE

## 2020-09-03 MED ORDER — NITROFURANTOIN MONOHYD MACRO 100 MG PO CAPS: 100.0000 mg | ORAL_CAPSULE | Freq: Two times a day (BID) | ORAL | 0 refills | Status: AC

## 2020-09-03 NOTE — Discharge Instructions (Signed)
Urine culture sent.  We will call you with the results.   Push fluids and get plenty of rest.   Take antibiotic as directed and to completion Follow up with PCP if symptoms persists Return here or go to ER if you have any new or worsening symptoms such as fever, worsening abdominal pain, nausea/vomiting, flank pain, etc... 

## 2020-09-03 NOTE — ED Triage Notes (Signed)
Urinary frequency since last night. Reports hx of uti's.

## 2020-09-03 NOTE — ED Provider Notes (Signed)
[facility]   Chief Complaint  Patient presents with  . Urinary Tract Infection     SUBJECTIVE:  Laura Little is a 29 y.o. female presented to the urgent care for complaint of urine frequency that started last night.  Patient denies a precipitating event, recent sexual encounter, excessive caffeine intake.  Has tried OTC medications without relief.  Symptoms are made worse with urination.  Admits to similar symptoms in the past.  Denies fever, chills, nausea, vomiting, abdominal pain, flank pain, abnormal vaginal discharge or bleeding, hematuria.    LMP: Patient's last menstrual period was 08/13/2020 (approximate).  ROS: As in HPI.  All other pertinent ROS negative.     Past Medical History:  Diagnosis Date  . LOC (loss of consciousness) ([place]) 02/21/2014  . No pertinent past medical history   . Normal pregnancy, repeat 02/21/2014  . SVD (spontaneous vaginal delivery) 02/22/2014   Past Surgical History:  Procedure Laterality Date  . LIPOMA EXCISION N/A 10/10/2018   Procedure: MINOR EXCISION SEBACEOUS 1CM CYST ON SCALP;  Surgeon: Virl Cagey, MD;  Location: [facility];  Service: General;  Laterality: N/A;  . LITHOTRIPSY    . NO PAST SURGERIES     Allergies  Allergen Reactions  . Bee Venom Swelling  . Excedrin Extra Strength [Aspirin-Acetaminophen-Caffeine] Hives   No current facility-administered medications on file prior to encounter.   Current Outpatient Medications on File Prior to Encounter  Medication Sig Dispense Refill  . escitalopram (LEXAPRO) 5 MG tablet Take 5 mg by mouth daily.    Marland Kitchen ibuprofen (ADVIL,MOTRIN) 200 MG tablet Take 600 mg by mouth every 8 (eight) hours as needed (headaches.).     Social History   Socioeconomic History  . Marital status: Married    Spouse name: Not on file  . Number of children: Not on file  . Years of education: Not on file  . Highest education level: Not on file  Occupational History  . Not on file  Tobacco Use    . Smoking status: Never Smoker  . Smokeless tobacco: Never Used  Substance and Sexual Activity  . Alcohol use: No  . Drug use: No  . Sexual activity: Yes    Birth control/protection: None  Other Topics Concern  . Not on file  Social History Narrative  . Not on file   Social Determinants of Health   Financial Resource Strain:   . Difficulty of Paying Living Expenses: Not on file  Food Insecurity:   . Worried About Charity fundraiser in the Last Year: Not on file  . Ran Out of Food in the Last Year: Not on file  Transportation Needs:   . Lack of Transportation (Medical): Not on file  . Lack of Transportation (Non-Medical): Not on file  Physical Activity:   . Days of Exercise per Week: Not on file  . Minutes of Exercise per Session: Not on file  Stress:   . Feeling of Stress : Not on file  Social Connections:   . Frequency of Communication with Friends and Family: Not on file  . Frequency of Social Gatherings with Friends and Family: Not on file  . Attends Religious Services: Not on file  . Active Member of Clubs or Organizations: Not on file  . Attends Archivist Meetings: Not on file  . Marital Status: Not on file  Intimate Partner Violence:   . Fear of Current or Ex-Partner: Not on file  . Emotionally Abused: Not on  file  . Physically Abused: Not on file  . Sexually Abused: Not on file   Family History  Problem Relation Age of Onset  . Hypertension Paternal Grandfather   . Heart attack Paternal Grandfather     OBJECTIVE:  Vitals:   09/03/20 1026 09/03/20 1029  BP:  109/73  Pulse:  72  Resp:  17  Temp:  98.4 F (36.9 C)  TempSrc:  Oral  SpO2:  96%  Weight: 110 lb (49.9 kg)   Height: 5\' 2"  (1.575 m)    General appearance: AOx3 in no acute distress HEENT: NCAT.  Oropharynx clear.  Lungs: clear to auscultation bilaterally without adventitious breath sounds Heart: regular rate and rhythm.  Radial pulses 2+ symmetrical bilaterally Abdomen: soft;  non-distended; no tenderness; bowel sounds present; no guarding or rebound tenderness Back: no CVA tenderness Extremities: no edema; symmetrical with no gross deformities Skin: warm and dry Neurologic: Ambulates from chair to exam table without difficulty Psychological: alert and cooperative; normal mood and affect  Labs Reviewed  POCT URINALYSIS DIP (MANUAL ENTRY) - Abnormal; Notable for the following components:      Result Value   Blood, UA trace-lysed (*)    Leukocytes, UA Trace (*)    All other components within normal limits  URINE CULTURE  POCT URINE PREGNANCY    ASSESSMENT & PLAN:  1. Urine frequency   2. Acute lower UTI     Meds ordered this encounter  Medications  . nitrofurantoin, macrocrystal-monohydrate, (MACROBID) 100 MG capsule    Sig: Take 1 capsule (100 mg total) by mouth 2 (two) times daily.    Dispense:  10 capsule    Refill:  0   Discharge instructions  Urine culture sent.  We will call you with the results.   Push fluids and get plenty of rest.   Take antibiotic as directed and to completion Follow up with PCP if symptoms persists Return here or go to ER if you have any new or worsening symptoms such as fever, worsening abdominal pain, nausea/vomiting, flank pain, etc...  Outlined signs and symptoms indicating need for more acute intervention. Patient verbalized understanding. After Visit Summary given.     Emerson Monte, FNP 09/03/20 1102

## 2020-09-04 LAB — URINE CULTURE
Culture: <10000 — AB
Special Requests: NORMAL

## 2020-09-05 DIAGNOSIS — Z20828 Contact with and (suspected) exposure to other viral communicable diseases: Secondary | ICD-10-CM | POA: Diagnosis not present

## 2020-09-07 ENCOUNTER — Encounter: Payer: Self-pay | Admitting: Oncology

## 2020-09-07 ENCOUNTER — Other Ambulatory Visit (HOSPITAL_COMMUNITY): Payer: Self-pay | Admitting: Oncology

## 2020-09-07 ENCOUNTER — Telehealth (HOSPITAL_COMMUNITY): Payer: Self-pay | Admitting: Oncology

## 2020-09-07 DIAGNOSIS — U071 COVID-19: Secondary | ICD-10-CM

## 2020-09-07 NOTE — Telephone Encounter (Signed)
I connected by phone with  Mrs. Mccants to discuss the potential use of an new treatment for mild to moderate COVID-19 viral infection in non-hospitalized patients.   This patient is a age/sex that meets the FDA criteria for Emergency Use Authorization of casirivimab\imdevimab.  Has a (+) direct SARS-CoV-2 viral test result 1. Has mild or moderate COVID-19  2. Is ? 31 years of age and weighs ? 40 kg 3. Is NOT hospitalized due to COVID-19 4. Is NOT requiring oxygen therapy or requiring an increase in baseline oxygen flow rate due to COVID-19 5. Is within 10 days of symptom onset 6. Has at least one of the high risk factor(s) for progression to severe COVID-19 and/or hospitalization as defined in EUA. Specific high risk criteria : Past Medical History:  Diagnosis Date  . LOC (loss of consciousness) ([place]) 02/21/2014  . No pertinent past medical history   . Normal pregnancy, repeat 02/21/2014  . SVD (spontaneous vaginal delivery) 02/22/2014  ?  ?    Symptom onset  09/02/2020   I have spoken and communicated the following to the patient or parent/caregiver:   1. FDA has authorized the emergency use of casirivimab\imdevimab for the treatment of mild to moderate COVID-19 in adults and pediatric patients with positive results of direct SARS-CoV-2 viral testing who are 55 years of age and older weighing at least 40 kg, and who are at high risk for progressing to severe COVID-19 and/or hospitalization.   2. The significant known and potential risks and benefits of casirivimab\imdevimab, and the extent to which such potential risks and benefits are unknown.   3. Information on available alternative treatments and the risks and benefits of those alternatives, including clinical trials.   4. Patients treated with casirivimab\imdevimab should continue to self-isolate and use infection control measures (e.g., wear mask, isolate, social distance, avoid sharing personal items, clean and disinfect "high  touch" surfaces, and frequent handwashing) according to CDC guidelines.    5. The patient or parent/caregiver has the option to accept or refuse casirivimab\imdevimab .   After reviewing this information with the patient, The patient agreed to proceed with receiving casirivimab\imdevimab infusion and will be provided a copy of the Fact sheet prior to receiving the infusion.Rulon Abide, AGNP-C [phone] ([place])

## 2020-09-08 ENCOUNTER — Other Ambulatory Visit (HOSPITAL_COMMUNITY): Payer: Self-pay

## 2020-09-08 ENCOUNTER — Ambulatory Visit (HOSPITAL_COMMUNITY)
Admission: RE | Admit: 2020-09-08 | Discharge: 2020-09-08 | Disposition: A | Discharge status: Home / Self Care | Payer: BC Managed Care – PPO | Source: Ambulatory Visit | Attending: Pulmonary Disease | Admitting: Pulmonary Disease

## 2020-09-08 DIAGNOSIS — U071 COVID-19: Secondary | ICD-10-CM | POA: Insufficient documentation

## 2020-09-08 MED ORDER — FAMOTIDINE IN NACL 20-0.9 MG/50ML-% IV SOLN: 20.0000 mg | Freq: Once | INTRAVENOUS | Status: DC | PRN

## 2020-09-08 MED ORDER — SODIUM CHLORIDE 0.9 % IV SOLN: Freq: Once | INTRAVENOUS | Status: AC

## 2020-09-08 MED ORDER — EPINEPHRINE 0.3 MG/0.3ML IJ SOAJ: 0.3000 mg | Freq: Once | INTRAMUSCULAR | Status: DC | PRN

## 2020-09-08 MED ORDER — ALBUTEROL SULFATE HFA 108 (90 BASE) MCG/ACT IN AERS: 2.0000 | INHALATION_SPRAY | Freq: Once | RESPIRATORY_TRACT | Status: DC | PRN

## 2020-09-08 MED ORDER — DIPHENHYDRAMINE HCL 50 MG/ML IJ SOLN: 50.0000 mg | Freq: Once | INTRAMUSCULAR | Status: DC | PRN

## 2020-09-08 MED ORDER — SODIUM CHLORIDE 0.9 % IV SOLN: INTRAVENOUS | Status: DC | PRN

## 2020-09-08 MED ORDER — METHYLPREDNISOLONE SODIUM SUCC 125 MG IJ SOLR: 125.0000 mg | Freq: Once | INTRAMUSCULAR | Status: DC | PRN

## 2020-09-08 NOTE — Discharge Instructions (Signed)
COVID-19 COVID-19 is a respiratory infection that is caused by a virus called severe acute respiratory syndrome coronavirus 2 (SARS-CoV-2). The disease is also known as coronavirus disease or novel coronavirus. In some people, the virus may not cause any symptoms. In others, it may cause a serious infection. The infection can get worse quickly and can lead to complications, such as:  Pneumonia, or infection of the lungs.  Acute respiratory distress syndrome or ARDS. This is a condition in which fluid build-up in the lungs prevents the lungs from filling with air and passing oxygen into the blood.  Acute respiratory failure. This is a condition in which there is not enough oxygen passing from the lungs to the body or when carbon dioxide is not passing from the lungs out of the body.  Sepsis or septic shock. This is a serious bodily reaction to an infection.  Blood clotting problems.  Secondary infections due to bacteria or fungus.  Organ failure. This is when your body's organs stop working. The virus that causes COVID-19 is contagious. This means that it can spread from person to person through droplets from coughs and sneezes (respiratory secretions). What are the causes? This illness is caused by a virus. You may catch the virus by:  Breathing in droplets from an infected person. Droplets can be spread by a person breathing, speaking, singing, coughing, or sneezing.  Touching something, like a table or a doorknob, that was exposed to the virus (contaminated) and then touching your mouth, nose, or eyes. What increases the risk? Risk for infection You are more likely to be infected with this virus if you:  Are within 6 feet (2 meters) of a person with COVID-19.  Provide care for or live with a person who is infected with COVID-19.  Spend time in crowded indoor spaces or live in shared housing. Risk for serious illness You are more likely to become seriously ill from the virus if  you:  Are 50 years of age or older. The higher your age, the more you are at risk for serious illness.  Live in a nursing home or long-term care facility.  Have cancer.  Have a long-term (chronic) disease such as: ? Chronic lung disease, including chronic obstructive pulmonary disease or asthma. ? A long-term disease that lowers your body's ability to fight infection (immunocompromised). ? Heart disease, including heart failure, a condition in which the arteries that lead to the heart become narrow or blocked (coronary artery disease), a disease which makes the heart muscle thick, weak, or stiff (cardiomyopathy). ? Diabetes. ? Chronic kidney disease. ? Sickle cell disease, a condition in which red blood cells have an abnormal "sickle" shape. ? Liver disease.  Are obese. What are the signs or symptoms? Symptoms of this condition can range from mild to severe. Symptoms may appear any time from 2 to 14 days after being exposed to the virus. They include:  A fever or chills.  A cough.  Difficulty breathing.  Headaches, body aches, or muscle aches.  Runny or stuffy (congested) nose.  A sore throat.  New loss of taste or smell. Some people may also have stomach problems, such as nausea, vomiting, or diarrhea. Other people may not have any symptoms of COVID-19. How is this diagnosed? This condition may be diagnosed based on:  Your signs and symptoms, especially if: ? You live in an area with a COVID-19 outbreak. ? You recently traveled to or from an area where the virus is common. ? You   provide care for or live with a person who was diagnosed with COVID-19. ? You were exposed to a person who was diagnosed with COVID-19.  A physical exam.  Lab tests, which may include: ? Taking a sample of fluid from the back of your nose and throat (nasopharyngeal fluid), your nose, or your throat using a swab. ? A sample of mucus from your lungs (sputum). ? Blood tests.  Imaging tests,  which may include, X-rays, CT scan, or ultrasound. How is this treated? At present, there is no medicine to treat COVID-19. Medicines that treat other diseases are being used on a trial basis to see if they are effective against COVID-19. Your health care provider will talk with you about ways to treat your symptoms. For most people, the infection is mild and can be managed at home with rest, fluids, and over-the-counter medicines. Treatment for a serious infection usually takes places in a hospital intensive care unit (ICU). It may include one or more of the following treatments. These treatments are given until your symptoms improve.  Receiving fluids and medicines through an IV.  Supplemental oxygen. Extra oxygen is given through a tube in the nose, a face mask, or a hood.  Positioning you to lie on your stomach (prone position). This makes it easier for oxygen to get into the lungs.  Continuous positive airway pressure (CPAP) or bi-level positive airway pressure (BPAP) machine. This treatment uses mild air pressure to keep the airways open. A tube that is connected to a motor delivers oxygen to the body.  Ventilator. This treatment moves air into and out of the lungs by using a tube that is placed in your windpipe.  Tracheostomy. This is a procedure to create a hole in the neck so that a breathing tube can be inserted.  Extracorporeal membrane oxygenation (ECMO). This procedure gives the lungs a chance to recover by taking over the functions of the heart and lungs. It supplies oxygen to the body and removes carbon dioxide. Follow these instructions at home: Lifestyle  If you are sick, stay home except to get medical care. Your health care provider will tell you how long to stay home. Call your health care provider before you go for medical care.  Rest at home as told by your health care provider.  Do not use any products that contain nicotine or tobacco, such as cigarettes,  e-cigarettes, and chewing tobacco. If you need help quitting, ask your health care provider.  Return to your normal activities as told by your health care provider. Ask your health care provider what activities are safe for you. General instructions  Take over-the-counter and prescription medicines only as told by your health care provider.  Drink enough fluid to keep your urine pale yellow.  Keep all follow-up visits as told by your health care provider. This is important. How is this prevented?  There is no vaccine to help prevent COVID-19 infection. However, there are steps you can take to protect yourself and others from this virus. To protect yourself:   Do not travel to areas where COVID-19 is a risk. The areas where COVID-19 is reported change often. To identify high-risk areas and travel restrictions, check the CDC travel website: wwwnc.cdc.gov/travel/notices  If you live in, or must travel to, an area where COVID-19 is a risk, take precautions to avoid infection. ? Stay away from people who are sick. ? Wash your hands often with soap and water for 20 seconds. If soap and water   are not available, use an alcohol-based hand sanitizer. ? Avoid touching your mouth, face, eyes, or nose. ? Avoid going out in public, follow guidance from your state and local health authorities. ? If you must go out in public, wear a cloth face covering or face mask. Make sure your mask covers your nose and mouth. ? Avoid crowded indoor spaces. Stay at least 6 feet (2 meters) away from others. ? Disinfect objects and surfaces that are frequently touched every day. This may include:  Counters and tables.  Doorknobs and light switches.  Sinks and faucets.  Electronics, such as phones, remote controls, keyboards, computers, and tablets. To protect others: If you have symptoms of COVID-19, take steps to prevent the virus from spreading to others.  If you think you have a COVID-19 infection, contact  your health care provider right away. Tell your health care team that you think you may have a COVID-19 infection.  Stay home. Leave your house only to seek medical care. Do not use public transport.  Do not travel while you are sick.  Wash your hands often with soap and water for 20 seconds. If soap and water are not available, use alcohol-based hand sanitizer.  Stay away from other members of your household. Let healthy household members care for children and pets, if possible. If you have to care for children or pets, wash your hands often and wear a mask. If possible, stay in your own room, separate from others. Use a different bathroom.  Make sure that all people in your household wash their hands well and often.  Cough or sneeze into a tissue or your sleeve or elbow. Do not cough or sneeze into your hand or into the air.  Wear a cloth face covering or face mask. Make sure your mask covers your nose and mouth. Where to find more information  Centers for Disease Control and Prevention: www.cdc.gov/coronavirus/2019-ncov/index.html  World Health Organization: www.who.int/health-topics/coronavirus Contact a health care provider if:  You live in or have traveled to an area where COVID-19 is a risk and you have symptoms of the infection.  You have had contact with someone who has COVID-19 and you have symptoms of the infection. Get help right away if:  You have trouble breathing.  You have pain or pressure in your chest.  You have confusion.  You have bluish lips and fingernails.  You have difficulty waking from sleep.  You have symptoms that get worse. These symptoms may represent a serious problem that is an emergency. Do not wait to see if the symptoms will go away. Get medical help right away. Call your local emergency services (911 in the [place] Do not drive yourself to the hospital. Let the emergency medical personnel know if you think you have  COVID-19. Summary  COVID-19 is a respiratory infection that is caused by a virus. It is also known as coronavirus disease or novel coronavirus. It can cause serious infections, such as pneumonia, acute respiratory distress syndrome, acute respiratory failure, or sepsis.  The virus that causes COVID-19 is contagious. This means that it can spread from person to person through droplets from breathing, speaking, singing, coughing, or sneezing.  You are more likely to develop a serious illness if you are 50 years of age or older, have a weak immune system, live in a nursing home, or have chronic disease.  There is no medicine to treat COVID-19. Your health care provider will talk with you about ways to treat your symptoms.    Take steps to protect yourself and others from infection. Wash your hands often and disinfect objects and surfaces that are frequently touched every day. Stay away from people who are sick and wear a mask if you are sick. This information is not intended to replace advice given to you by your health care provider. Make sure you discuss any questions you have with your health care provider. Document Revised: 09/11/2019 Document Reviewed: 12/18/2018 [employer] Patient Education  2020 [employer] What types of side effects do monoclonal antibody drugs cause?  Common side effects  In general, the more common side effects caused by monoclonal antibody drugs include: . Allergic reactions, such as hives or itching . Flu-like signs and symptoms, including chills, fatigue, fever, and muscle aches and pains . Nausea, vomiting . Diarrhea . Skin rashes . Low blood pressure   The CDC is recommending patients who receive monoclonal antibody treatments wait at least 90 days before being vaccinated.  Currently, there are no data on the safety and efficacy of mRNA COVID-19 vaccines in persons who received monoclonal antibodies or convalescent plasma as part of COVID-19 treatment. Based  on the estimated half-life of such therapies as well as evidence suggesting that reinfection is uncommon in the 90 days after initial infection, vaccination should be deferred for at least 90 days, as a precautionary measure until additional information becomes available, to avoid interference of the antibody treatment with vaccine-induced immune responses. 

## 2020-09-08 NOTE — Progress Notes (Signed)
  Diagnosis: COVID-19  Physician: Dr. Joya Gaskins  Procedure: [facility] Med: bamlanivimab\etesevimab infusion - Provided patient with bamlanimivab\etesevimab fact sheet for patients, parents and caregivers prior to infusion.  Complications: No immediate complications noted.  Discharge: Discharged home   Laura Little 09/08/2020

## 2020-10-19 DIAGNOSIS — J029 Acute pharyngitis, unspecified: Secondary | ICD-10-CM | POA: Diagnosis not present
